# Patient Record
Sex: Female | Born: 1990 | Race: Black or African American | Hispanic: No | Smoking: Never smoker
Health system: Southern US, Community
[De-identification: ages and names within clinical notes are randomized; demographics above are authoritative.]

## PROBLEM LIST (undated history)

## (undated) DIAGNOSIS — J45909 Unspecified asthma, uncomplicated: Secondary | ICD-10-CM

## (undated) DIAGNOSIS — F259 Schizoaffective disorder, unspecified: Secondary | ICD-10-CM

## (undated) DIAGNOSIS — R0600 Dyspnea, unspecified: Secondary | ICD-10-CM

## (undated) DIAGNOSIS — F319 Bipolar disorder, unspecified: Secondary | ICD-10-CM

## (undated) DIAGNOSIS — T1491XA Suicide attempt, initial encounter: Secondary | ICD-10-CM

## (undated) DIAGNOSIS — G43909 Migraine, unspecified, not intractable, without status migrainosus: Secondary | ICD-10-CM

## (undated) DIAGNOSIS — F419 Anxiety disorder, unspecified: Secondary | ICD-10-CM

## (undated) DIAGNOSIS — F329 Major depressive disorder, single episode, unspecified: Secondary | ICD-10-CM

## (undated) DIAGNOSIS — F209 Schizophrenia, unspecified: Secondary | ICD-10-CM

## (undated) DIAGNOSIS — F32A Depression, unspecified: Secondary | ICD-10-CM

## (undated) DIAGNOSIS — S069X9A Unspecified intracranial injury with loss of consciousness of unspecified duration, initial encounter: Secondary | ICD-10-CM

## (undated) DIAGNOSIS — K219 Gastro-esophageal reflux disease without esophagitis: Secondary | ICD-10-CM

---
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
## 2010-02-16 ENCOUNTER — Emergency Department (HOSPITAL_COMMUNITY): Admission: EM | Admit: 2010-02-16 | Discharge: 2010-02-16 | Payer: Self-pay | Admitting: Emergency Medicine

## 2010-04-07 IMAGING — CT CT ANGIO CHEST
2 of 6 series · 19 of 36 positions shown · IV contrast (APPLIED)
Comparison: Chest radiograph 06/08/2008]

CLINICAL DATA: Right-sided chest pain, elevated D-dimer

CT ANGIOGRAPHY CHEST
TECHNIQUE: Multidetector CT imaging of the chest using the
standard protocol during bolus administration of intravenous
contrast. Multiplanar reconstructed images obtained and reviewed to
evaluate the vascular anatomy.
Contrast: 80 ml Omniscan 300 IV

[Series 19: thins for terarecon · axial · 0.60mm/px · z∈[+1130,+1384]mm · 18 of 283 slices shown]
[im 15/283  lung]
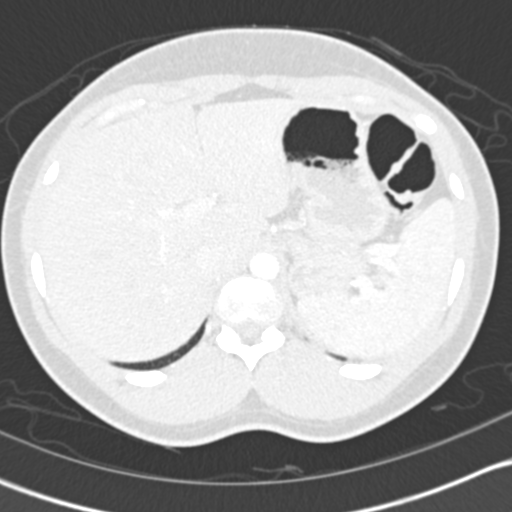
[im 29/283  mediastinal]
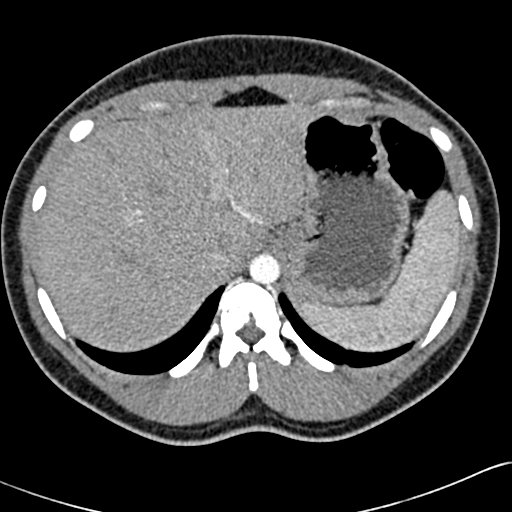
[im 43/283  lung]
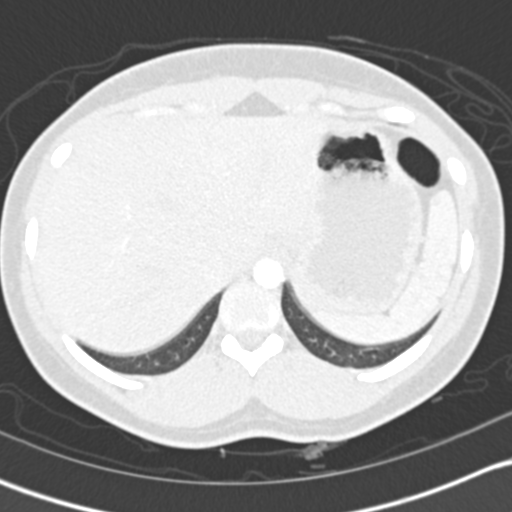
[im 57/283  mediastinal]
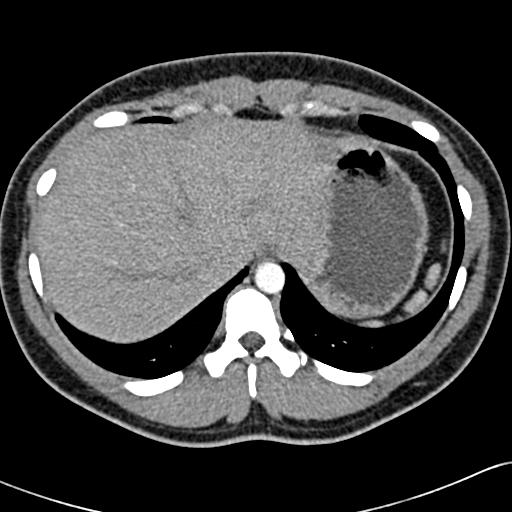
[im 71/283  lung]
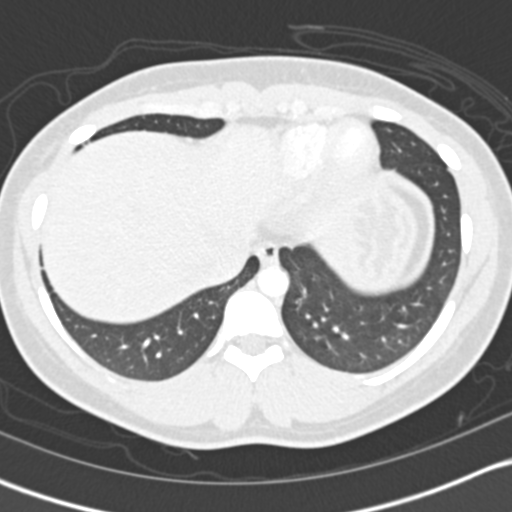
[im 85/283  mediastinal]
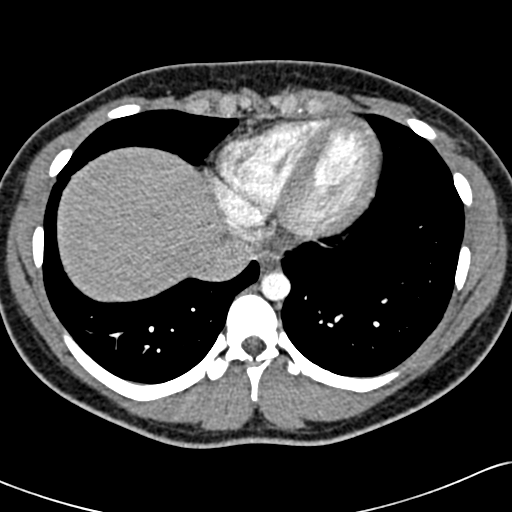
[im 99/283  lung]
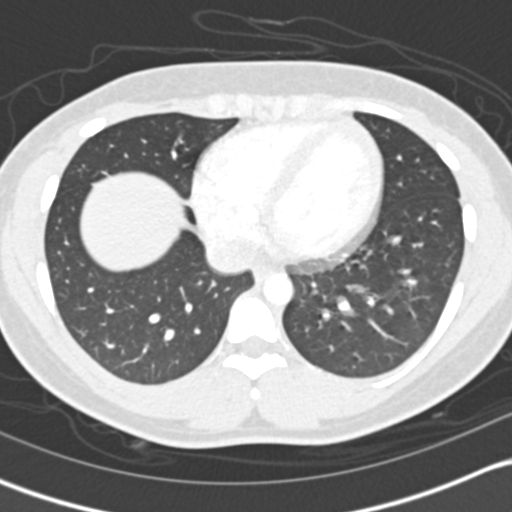
[im 113/283  mediastinal]
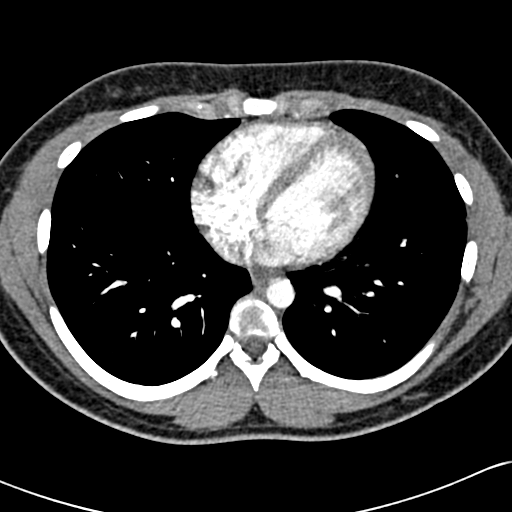
[im 127/283  lung]
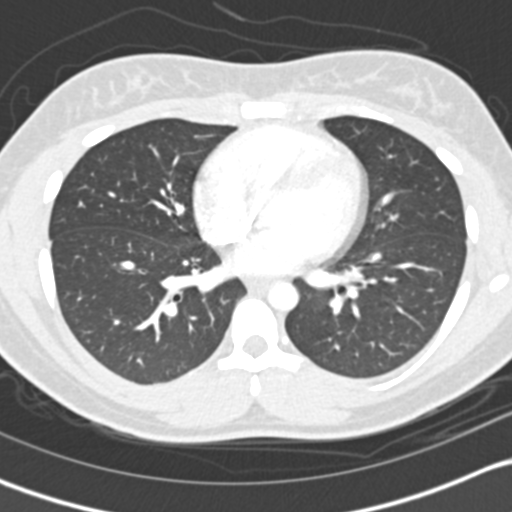
[im 156/283  mediastinal]
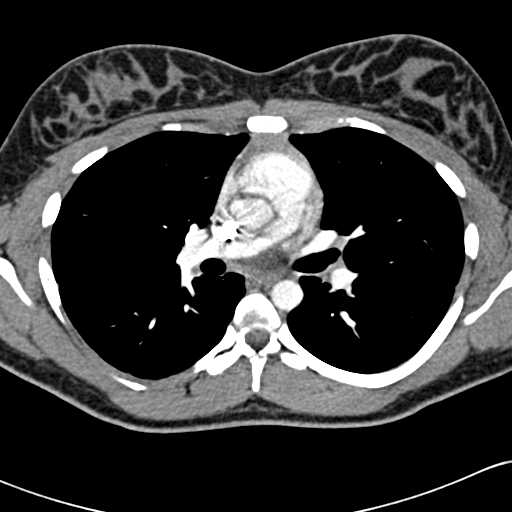
[im 170/283  lung]
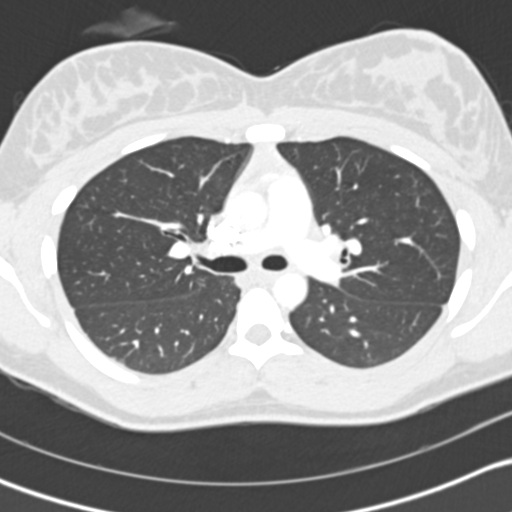
[im 184/283  mediastinal]
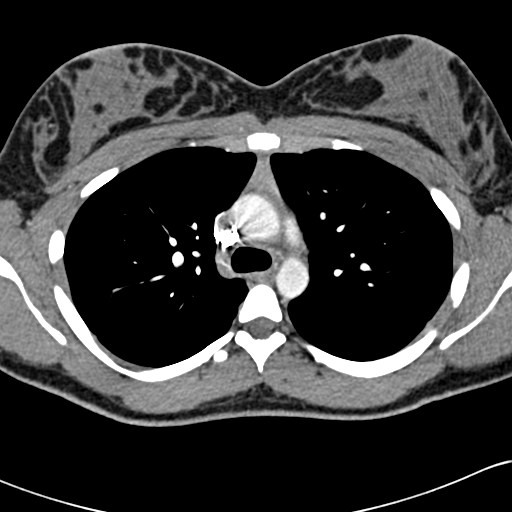
[im 198/283  lung]
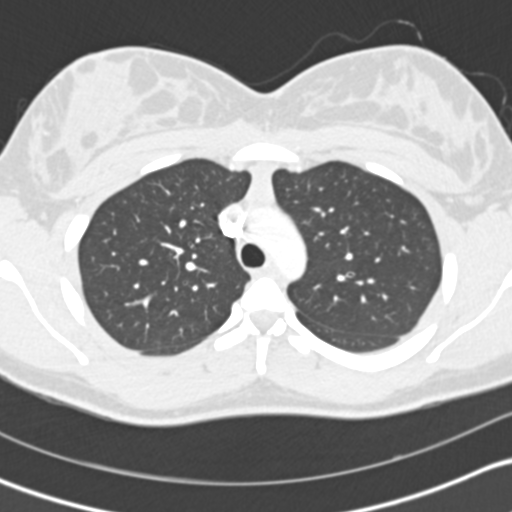
[im 212/283  mediastinal]
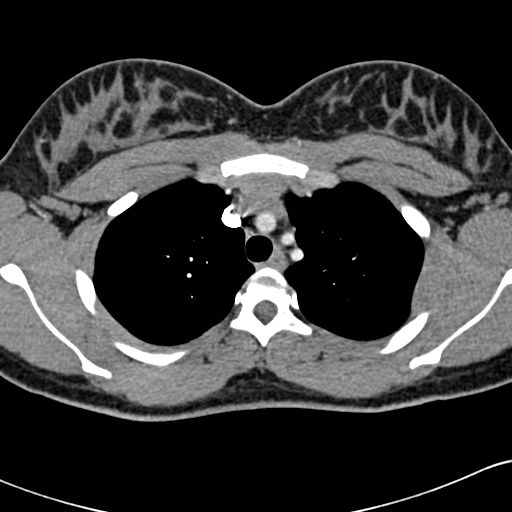
[im 226/283  lung]
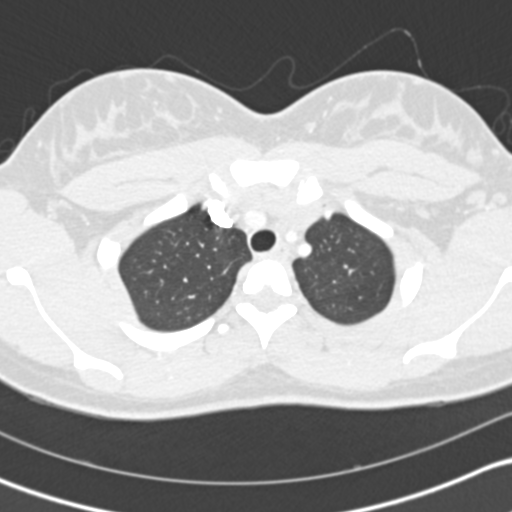
[im 240/283  mediastinal]
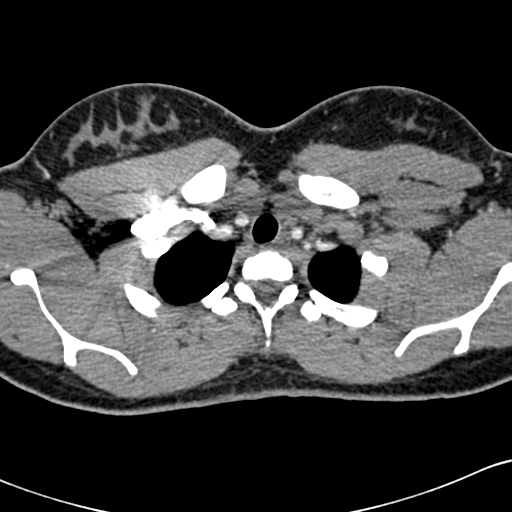
[im 254/283  lung]
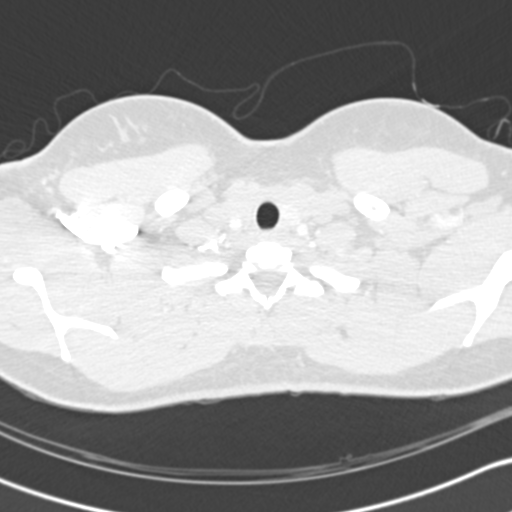
[im 268/283  mediastinal]
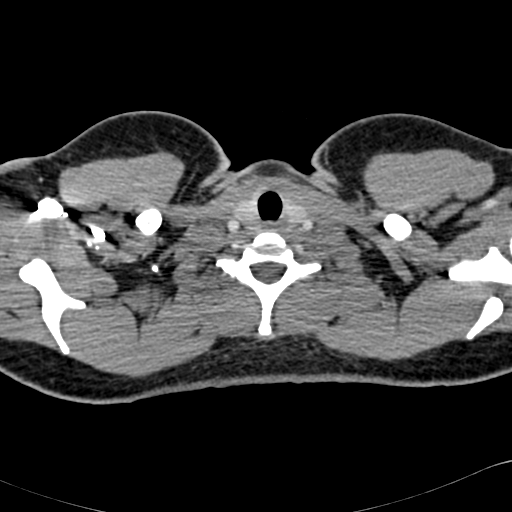

[Series 602: coronal mpr · coronal · 0.60mm/px · 1 of 89 slices shown]
[im 45/89  mediastinal]
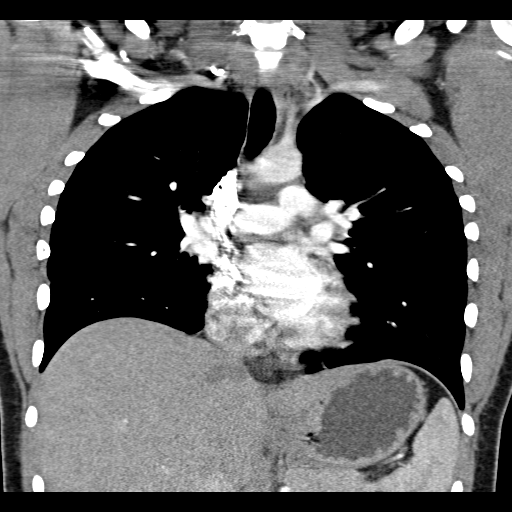

[19 of 36 positions shown; findings below may reference images not displayed]

FINDINGS: The study is of adequate technical quality for evaluation
for pulmonary embolism up to and including the 3rd order pulmonary
arteries. There is extensive streak artifact from dense
opacification of the IVC which produces artifactual linear
attenuation artifact in the right upper lobe pulmonary artery, but
there is no focal filling defect to suggest pulmonary arterial
embolism up to and including the 3rd order pulmonary arteries.

Heart size is normal.  No pericardial or pleural effusion.  No
lymphadenopathy. There is minimal tree in bud pulmonary nodular
opacity in the left upper lobe, image 41 and image 49 with a 6 mm
area of ill-defined nodular ground-glass opacity noted.  Central
airways are patent.

No acute bony finding.
IMPRESSION: No pulmonary embolism identified up to and including the 3rd order
pulmonary arteries.

Minimal left upper lobe tree in bud nodular opacity which may
reflect subclinical small airways infectious/inflammatory etiology.

## 2010-04-13 ENCOUNTER — Emergency Department (HOSPITAL_COMMUNITY): Admission: EM | Admit: 2010-04-13 | Discharge: 2010-04-13 | Payer: Self-pay | Admitting: Emergency Medicine

## 2010-04-26 DEATH — deceased

## 2010-05-30 ENCOUNTER — Emergency Department (HOSPITAL_COMMUNITY): Admission: EM | Admit: 2010-05-30 | Discharge: 2010-05-30 | Payer: Self-pay | Admitting: Emergency Medicine

## 2010-05-30 ENCOUNTER — Emergency Department (HOSPITAL_COMMUNITY): Admission: EM | Admit: 2010-05-30 | Discharge: 2010-05-30 | Payer: Self-pay | Admitting: Family Medicine

## 2010-06-01 ENCOUNTER — Emergency Department (HOSPITAL_COMMUNITY): Admission: EM | Admit: 2010-06-01 | Discharge: 2010-06-02 | Payer: Self-pay | Admitting: Emergency Medicine

## 2010-06-10 ENCOUNTER — Emergency Department (HOSPITAL_COMMUNITY): Admission: EM | Admit: 2010-06-10 | Discharge: 2010-06-10 | Payer: Self-pay | Admitting: Emergency Medicine

## 2010-08-09 ENCOUNTER — Emergency Department (HOSPITAL_COMMUNITY)
Admission: EM | Admit: 2010-08-09 | Discharge: 2010-08-09 | Payer: Self-pay | Source: Home / Self Care | Admitting: Emergency Medicine

## 2010-09-15 ENCOUNTER — Inpatient Hospital Stay (HOSPITAL_COMMUNITY)
Admission: AD | Admit: 2010-09-15 | Discharge: 2010-09-15 | Payer: Self-pay | Source: Home / Self Care | Attending: Obstetrics & Gynecology | Admitting: Obstetrics & Gynecology

## 2010-09-26 NOTE — L&D Delivery Note (Signed)
Delivery Note At 9:05 AM a viable female, Erin Hernandez, was delivered via Vaginal, Spontaneous Delivery (Presentation OA ;  ).  APGAR: 5, 8; weight 6 lb 13.3 oz (3099 g).   Placenta status: Intact, Spontaneous.  Cord: 3 vessels with the following complications: None.  Infant had rapid 2nd stage, with increased mucus at delivery.  O2 sats were variable (see nurses notes), so after assessment, infant was shown to mother and then taken to [facility] for further evaluation.  Had received initial dose of PCN.  Was complete @ 8:50am.  Anesthesia: Epidural Local  Episiotomy: None Lacerations: 1st degree perineal, 2nd degree left sulcus Suture Repair: 3.0 Est. Blood Loss (mL): 200  Mom to postpartum.  Baby to [facility], placed under O2 hood in nursery.  Sats now 96% under hood.  FOB at bedside.  Mom apprised of baby's status. Outpatient circ planned.  Erin Hernandez 04/29/2011, 9:52 AM

## 2010-11-18 ENCOUNTER — Inpatient Hospital Stay (HOSPITAL_COMMUNITY)
Admission: AD | Admit: 2010-11-18 | Discharge: 2010-11-18 | Disposition: A | Discharge status: Home / Self Care | Payer: Medicaid Other | Source: Ambulatory Visit | Attending: Obstetrics | Admitting: Obstetrics

## 2010-11-18 DIAGNOSIS — O2650 Maternal hypotension syndrome, unspecified trimester: Secondary | ICD-10-CM

## 2010-11-18 LAB — CBC
Hemoglobin: 11.7 g/dL — ABNORMAL LOW (ref 12.0–15.0)
MCHC: 33.4 g/dL (ref 30.0–36.0)
RDW: 14.3 % (ref 11.5–15.5)
WBC: 7.4 10*3/uL (ref 4.0–10.5)

## 2010-11-26 ENCOUNTER — Inpatient Hospital Stay (HOSPITAL_COMMUNITY)
Admission: AD | Admit: 2010-11-26 | Discharge: 2010-11-26 | Disposition: A | Discharge status: Home / Self Care | Payer: Medicaid Other | Source: Ambulatory Visit | Attending: Obstetrics & Gynecology | Admitting: Obstetrics & Gynecology

## 2010-11-26 DIAGNOSIS — K219 Gastro-esophageal reflux disease without esophagitis: Secondary | ICD-10-CM | POA: Insufficient documentation

## 2010-11-26 DIAGNOSIS — O99891 Other specified diseases and conditions complicating pregnancy: Secondary | ICD-10-CM | POA: Insufficient documentation

## 2010-12-06 LAB — WET PREP, GENITAL
Clue Cells Wet Prep HPF POC: NONE SEEN
Yeast Wet Prep HPF POC: NONE SEEN

## 2010-12-06 LAB — HEPATITIS B SURFACE ANTIGEN: Hepatitis B Surface Ag: NEGATIVE

## 2010-12-06 LAB — RPR: RPR: NONREACTIVE

## 2010-12-06 LAB — HCG, QUANTITATIVE, PREGNANCY: hCG, Beta Chain, Quant, S: 75710 m[IU]/mL — ABNORMAL HIGH (ref <5)

## 2010-12-06 LAB — CBC
HCT: 46.2 % — ABNORMAL HIGH (ref 36.0–46.0)
MCH: 29.1 pg (ref 26.0–34.0)
MCHC: 34.6 g/dL (ref 30.0–36.0)
RBC: 5.5 MIL/uL — ABNORMAL HIGH (ref 3.87–5.11)
RDW: 14.9 % (ref 11.5–15.5)
WBC: 12 10*3/uL — ABNORMAL HIGH (ref 4.0–10.5)

## 2010-12-06 LAB — BASIC METABOLIC PANEL
CO2: 23 mEq/L (ref 19–32)
Creatinine, Ser: 0.73 mg/dL (ref 0.4–1.2)
GFR calc Af Amer: >60 mL/min (ref >60)
GFR calc non Af Amer: >60 mL/min (ref >60)
Potassium: 4 mEq/L (ref 3.5–5.1)
Sodium: 137 mEq/L (ref 135–145)

## 2010-12-06 LAB — URINALYSIS, ROUTINE W REFLEX MICROSCOPIC
Ketones, ur: 15 mg/dL — AB
Nitrite: NEGATIVE
Protein, ur: NEGATIVE mg/dL
pH: 6 (ref 5.0–8.0)

## 2010-12-06 LAB — ABO/RH: RH Type: POSITIVE

## 2010-12-06 LAB — POCT PREGNANCY, URINE: Preg Test, Ur: POSITIVE

## 2010-12-09 LAB — POCT URINALYSIS DIPSTICK
Ketones, ur: NEGATIVE mg/dL
Protein, ur: 100 mg/dL — AB
pH: 6 (ref 5.0–8.0)

## 2010-12-09 LAB — POCT I-STAT, CHEM 8
Creatinine, Ser: 0.6 mg/dL (ref 0.4–1.2)
Glucose, Bld: 73 mg/dL (ref 70–99)
Hemoglobin: 13.6 g/dL (ref 12.0–15.0)
Potassium: 5.5 mEq/L — ABNORMAL HIGH (ref 3.5–5.1)
TCO2: 25 mmol/L (ref 0–100)

## 2010-12-09 LAB — COMPREHENSIVE METABOLIC PANEL
ALT: 11 U/L (ref 0–35)
AST: 15 U/L (ref 0–37)
Albumin: 3.5 g/dL (ref 3.5–5.2)
Alkaline Phosphatase: 72 U/L (ref 39–117)
Calcium: 9.3 mg/dL (ref 8.4–10.5)
GFR calc Af Amer: >60 mL/min (ref >60)
Glucose, Bld: 81 mg/dL (ref 70–99)
Potassium: 3.3 mEq/L — ABNORMAL LOW (ref 3.5–5.1)
Sodium: 142 mEq/L (ref 135–145)
Total Protein: 7 g/dL (ref 6.0–8.3)

## 2010-12-09 LAB — DIFFERENTIAL
Basophils Relative: 0 % (ref 0–1)
Eosinophils Absolute: 0.1 10*3/uL (ref 0.0–0.7)
Eosinophils Absolute: 0 10*3/uL (ref 0.0–0.7)
Eosinophils Relative: 1 % (ref 0–5)
Eosinophils Relative: 0 % (ref 0–5)
Lymphs Abs: 2.5 10*3/uL (ref 0.7–4.0)
Lymphs Abs: 1.8 10*3/uL (ref 0.7–4.0)
Monocytes Absolute: 1.6 10*3/uL — ABNORMAL HIGH (ref 0.1–1.0)
Monocytes Relative: 7 % (ref 3–12)
Monocytes Relative: 11 % (ref 3–12)

## 2010-12-09 LAB — CBC
HCT: 37.6 % (ref 36.0–46.0)
Hemoglobin: 11.9 g/dL — ABNORMAL LOW (ref 12.0–15.0)
MCH: 27.7 pg (ref 26.0–34.0)
MCV: 82.6 fL (ref 78.0–100.0)
Platelets: 251 10*3/uL (ref 150–400)
RBC: 4.3 MIL/uL (ref 3.87–5.11)
RDW: 14.7 % (ref 11.5–15.5)
WBC: 14.9 10*3/uL — ABNORMAL HIGH (ref 4.0–10.5)

## 2010-12-09 LAB — URINALYSIS, ROUTINE W REFLEX MICROSCOPIC
Bilirubin Urine: NEGATIVE
Glucose, UA: NEGATIVE mg/dL
Ketones, ur: NEGATIVE mg/dL
pH: 7 (ref 5.0–8.0)

## 2010-12-09 LAB — WET PREP, GENITAL: Yeast Wet Prep HPF POC: NONE SEEN

## 2010-12-11 LAB — GC/CHLAMYDIA PROBE AMP, GENITAL
Chlamydia, DNA Probe: POSITIVE — AB
GC Probe Amp, Genital: NEGATIVE

## 2010-12-11 LAB — URINALYSIS, ROUTINE W REFLEX MICROSCOPIC
Bilirubin Urine: NEGATIVE
Hgb urine dipstick: NEGATIVE
Nitrite: NEGATIVE
Protein, ur: NEGATIVE mg/dL
Specific Gravity, Urine: 1.021 (ref 1.005–1.030)
Urobilinogen, UA: 0.2 mg/dL (ref 0.0–1.0)

## 2010-12-11 LAB — WET PREP, GENITAL

## 2010-12-11 LAB — URINE MICROSCOPIC-ADD ON

## 2010-12-11 LAB — PREGNANCY, URINE: Preg Test, Ur: NEGATIVE

## 2010-12-13 LAB — POCT PREGNANCY, URINE: Preg Test, Ur: NEGATIVE

## 2010-12-17 LAB — POCT PREGNANCY, URINE: Preg Test, Ur: NEGATIVE

## 2010-12-17 LAB — POCT URINALYSIS DIP (DEVICE)
Bilirubin Urine: NEGATIVE
Ketones, ur: 15 mg/dL — AB
Protein, ur: NEGATIVE mg/dL
Specific Gravity, Urine: 1.01 (ref 1.005–1.030)

## 2010-12-21 ENCOUNTER — Inpatient Hospital Stay (HOSPITAL_COMMUNITY)
Admission: AD | Admit: 2010-12-21 | Discharge: 2010-12-21 | Disposition: A | Discharge status: Home / Self Care | Payer: Medicaid Other | Source: Ambulatory Visit | Attending: Obstetrics | Admitting: Obstetrics

## 2010-12-21 DIAGNOSIS — O99891 Other specified diseases and conditions complicating pregnancy: Secondary | ICD-10-CM | POA: Insufficient documentation

## 2010-12-21 DIAGNOSIS — O9989 Other specified diseases and conditions complicating pregnancy, childbirth and the puerperium: Secondary | ICD-10-CM

## 2010-12-21 DIAGNOSIS — J40 Bronchitis, not specified as acute or chronic: Secondary | ICD-10-CM | POA: Insufficient documentation

## 2011-01-06 LAB — URINALYSIS, ROUTINE W REFLEX MICROSCOPIC
Bilirubin Urine: NEGATIVE
Hgb urine dipstick: NEGATIVE
Nitrite: NEGATIVE
Protein, ur: NEGATIVE mg/dL
Specific Gravity, Urine: 1.009 (ref 1.005–1.030)
Specific Gravity, Urine: 1.012 (ref 1.005–1.030)
Urobilinogen, UA: 0.2 mg/dL (ref 0.0–1.0)
pH: 6.5 (ref 5.0–8.0)

## 2011-01-06 LAB — BASIC METABOLIC PANEL
CO2: 24 mEq/L (ref 19–32)
CO2: 24 mEq/L (ref 19–32)
Calcium: 9.6 mg/dL (ref 8.4–10.5)
Calcium: 9 mg/dL (ref 8.4–10.5)
Creatinine, Ser: 0.9 mg/dL (ref 0.4–1.2)
Glucose, Bld: 94 mg/dL (ref 70–99)
Potassium: 3.9 mEq/L (ref 3.5–5.1)
Sodium: 136 mEq/L (ref 135–145)
Sodium: 136 mEq/L (ref 135–145)

## 2011-01-06 LAB — DIFFERENTIAL
Basophils Absolute: 0 10*3/uL (ref 0.0–0.1)
Eosinophils Relative: 2 % (ref 0–5)
Lymphocytes Relative: 18 % — ABNORMAL LOW (ref 24–48)
Monocytes Absolute: 0.8 10*3/uL (ref 0.2–1.2)
Monocytes Relative: 8 % (ref 3–11)
Neutro Abs: 6.9 10*3/uL (ref 1.7–8.0)

## 2011-01-06 LAB — HEPATIC FUNCTION PANEL
ALT: 19 U/L (ref 0–35)
Albumin: 3.5 g/dL (ref 3.5–5.2)
Alkaline Phosphatase: 72 U/L (ref 47–119)
Total Bilirubin: 0.5 mg/dL (ref 0.3–1.2)
Total Protein: 6.6 g/dL (ref 6.0–8.3)

## 2011-01-06 LAB — LITHIUM LEVEL: Lithium Lvl: 0.96 mEq/L (ref 0.80–1.40)

## 2011-01-06 LAB — URINALYSIS, DIPSTICK ONLY
Bilirubin Urine: NEGATIVE
Leukocytes, UA: NEGATIVE
Nitrite: NEGATIVE
Specific Gravity, Urine: 1.014 (ref 1.005–1.030)
Urobilinogen, UA: 0.2 mg/dL (ref 0.0–1.0)
pH: 7 (ref 5.0–8.0)

## 2011-01-06 LAB — CBC
HCT: 37.9 % (ref 36.0–49.0)
Hemoglobin: 12.6 g/dL (ref 12.0–16.0)
MCHC: 33.3 g/dL (ref 31.0–37.0)
RBC: 4.47 MIL/uL (ref 3.80–5.70)
RDW: 14.5 % (ref 11.4–15.5)

## 2011-01-06 LAB — LIPID PANEL
HDL: 28 mg/dL — ABNORMAL LOW (ref >34)
LDL Cholesterol: 97 mg/dL (ref 0–109)
Total CHOL/HDL Ratio: 4.8 RATIO
Triglycerides: 46 mg/dL (ref <150)
VLDL: 9 mg/dL (ref 0–40)

## 2011-01-06 LAB — DRUGS OF ABUSE SCREEN W/O ALC, ROUTINE URINE
Amphetamine Screen, Ur: NEGATIVE
Barbiturate Quant, Ur: NEGATIVE
Benzodiazepines.: NEGATIVE
Marijuana Metabolite: NEGATIVE
Methadone: NEGATIVE
Phencyclidine (PCP): NEGATIVE

## 2011-01-06 LAB — GC/CHLAMYDIA PROBE AMP, URINE: GC Probe Amp, Urine: NEGATIVE

## 2011-01-06 LAB — RPR: RPR Ser Ql: NONREACTIVE

## 2011-01-10 LAB — URINALYSIS, ROUTINE W REFLEX MICROSCOPIC
Bilirubin Urine: NEGATIVE
Glucose, UA: NEGATIVE mg/dL
Hgb urine dipstick: NEGATIVE
Ketones, ur: NEGATIVE mg/dL
Nitrite: NEGATIVE
Specific Gravity, Urine: 1 — ABNORMAL LOW (ref 1.005–1.030)
pH: 6 (ref 5.0–8.0)

## 2011-01-10 LAB — CBC
HCT: 40.8 % (ref 36.0–49.0)
Hemoglobin: 13.6 g/dL (ref 12.0–16.0)
Platelets: 275 10*3/uL (ref 150–400)
RBC: 4.7 MIL/uL (ref 3.80–5.70)
WBC: 11.7 10*3/uL (ref 4.5–13.5)

## 2011-01-10 LAB — HEMOCCULT GUIAC POC 1CARD (OFFICE): Fecal Occult Bld: NEGATIVE

## 2011-01-10 LAB — POCT I-STAT, CHEM 8
BUN: 4 mg/dL — ABNORMAL LOW (ref 6–23)
Creatinine, Ser: 1 mg/dL (ref 0.4–1.2)
Glucose, Bld: 77 mg/dL (ref 70–99)
Potassium: 3.8 mEq/L (ref 3.5–5.1)
Sodium: 140 mEq/L (ref 135–145)
TCO2: 23 mmol/L (ref 0–100)

## 2011-01-10 LAB — DIFFERENTIAL
Eosinophils Relative: 2 % (ref 0–5)
Lymphocytes Relative: 23 % — ABNORMAL LOW (ref 24–48)
Lymphs Abs: 2.7 10*3/uL (ref 1.1–4.8)
Monocytes Relative: 10 % (ref 3–11)
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
## 2011-04-28 ENCOUNTER — Encounter (HOSPITAL_COMMUNITY): Payer: Self-pay | Admitting: *Deleted

## 2011-04-28 ENCOUNTER — Inpatient Hospital Stay (HOSPITAL_COMMUNITY)
Admission: AD | Admit: 2011-04-28 | Discharge: 2011-04-28 | Disposition: A | Discharge status: Home / Self Care | Payer: Medicaid Other | Source: Ambulatory Visit | Attending: Obstetrics and Gynecology | Admitting: Obstetrics and Gynecology

## 2011-04-28 ENCOUNTER — Other Ambulatory Visit: Payer: Self-pay

## 2011-04-28 DIAGNOSIS — O479 False labor, unspecified: Secondary | ICD-10-CM

## 2011-04-28 DIAGNOSIS — N898 Other specified noninflammatory disorders of vagina: Secondary | ICD-10-CM | POA: Insufficient documentation

## 2011-04-28 DIAGNOSIS — O9989 Other specified diseases and conditions complicating pregnancy, childbirth and the puerperium: Secondary | ICD-10-CM | POA: Insufficient documentation

## 2011-04-28 DIAGNOSIS — Z22330 Carrier of Group B streptococcus: Secondary | ICD-10-CM | POA: Insufficient documentation

## 2011-04-28 DIAGNOSIS — O99891 Other specified diseases and conditions complicating pregnancy: Secondary | ICD-10-CM | POA: Insufficient documentation

## 2011-04-28 MED ORDER — ZOLPIDEM TARTRATE 10 MG PO TABS
10.0000 mg | ORAL_TABLET | Freq: Once | ORAL | Status: AC
  Administered 2011-04-28: 10 mg via ORAL
  Filled 2011-04-28: qty 1

## 2011-04-28 MED ORDER — ZOLPIDEM TARTRATE 10 MG PO TABS: 10.0000 mg | ORAL_TABLET | Freq: Every evening | ORAL | Status: DC | PRN

## 2011-04-28 NOTE — Progress Notes (Signed)
SAYS NOTICED - WHEN CALLED [facility]- SAW BLOOD  AND SAW CLEAR FLUID  AT 9PM.  DENIES MRSA AND HSV.  UC ALL DAY-  VE- TODAY 1 CM

## 2011-04-28 NOTE — ED Provider Notes (Signed)
History   Pt is 19y.o. BF G2P0010 at 38.5 weeks per [facility] 06/07/11, presents for eval for CC continued brown/mucousy d/c and labor check. Pt seen at office for regular appt at 0830, and secondary to ctxs, asked to return after lunch to recheck cx.  Pt had IC last night.  Cx 1-2cm/75% at office Per Anabel Halon, CNM's exam.  Pt reports ctx frequency same, irregular.  No gushes of fluid.  GFM.   Followed by CNM service.  Hx r/f 1.  GBS positive  2.  Asthma  3.  Bipolar--no meds in pregnancy  4.  30 week transfer from [facility] to [facility] for CNM care  No chief complaint on file.  HPI  OB History    Grav Para Term Preterm Abortions TAB SAB Ect Mult Living   2    1 1     0      Past Medical History  Diagnosis Date  . Asthma     Past Surgical History  Procedure Date  . No past surgeries     History reviewed. No pertinent family history.  History  Substance Use Topics  . Smoking status: Former Games developer  . Smokeless tobacco: Not on file  . Alcohol Use: No    Allergies: No Known Allergies  Prescriptions prior to admission  Medication Sig Dispense Refill  . ALBUTEROL IN Inhale 4 puffs into the lungs daily as needed. For asthma       . DiphenhydrAMINE HCl (BENADRYL ALLERGY PO) Take 1 tablet by mouth daily.        . Prenatal Vit-Fe Fumarate-FA (PRENATAL PLUS) 65-1 MG TABS Take 1 tablet by mouth daily.          Review of Systems  Constitutional: Negative.   HENT: Negative.   Respiratory: Negative.   Genitourinary: Negative.    Physical Exam   Blood pressure 114/66, pulse 63, temperature 98.2 F (36.8 C), temperature source Oral, resp. rate 22, height 5\' 4"  (1.626 m), weight 88.055 kg (194 lb 2 oz).  Physical Exam  Constitutional: She is oriented to person, place, and time. She appears well-developed and well-nourished.  Respiratory: Effort normal.  GI: Soft. Bowel sounds are normal.       gravid  Genitourinary: Vagina normal.       Scant amt of bloody show on glove; SSE: small amt  of mucousy d/c, mixed with dk brown blood noted in vault. Cx:  1.5/80/-1, membranes palpated; to pt's Lt, posterior  Musculoskeletal: Normal range of motion.  Neurological: She is alert and oriented to person, place, and time. She has normal reflexes.  Skin: Skin is warm and dry.   EFM:  130, reactive, moderate variability, no decels TOCO:  irreg UC's q 2-7 minutes, mild-mod  MAU Course  Procedures 1.  SSE 2.  NST 3.  cx check 4.  Ambien 10mg  po x1 before d/c home  Assessment and Plan  1.  IUP at 38.5 2.  Reactive FHT 3.  GBS positive 4.  No cx change after 3 cx exams today  1.  D/c home with labor precautions & fkc's 2.  F/u next week at [facility] or prn concerns 3.  Ambien 10mg  po x1 before d/c; RX also given w/ #30 w/ 1RF  Damyn Weitzel H 04/28/2011, 10:57 PM

## 2011-04-29 ENCOUNTER — Inpatient Hospital Stay (HOSPITAL_COMMUNITY): Payer: Medicaid Other | Admitting: Anesthesiology

## 2011-04-29 ENCOUNTER — Encounter (HOSPITAL_COMMUNITY): Payer: Self-pay | Admitting: Anesthesiology

## 2011-04-29 ENCOUNTER — Other Ambulatory Visit: Payer: Self-pay | Admitting: Obstetrics and Gynecology

## 2011-04-29 ENCOUNTER — Inpatient Hospital Stay (HOSPITAL_COMMUNITY)
Admission: AD | Admit: 2011-04-29 | Discharge: 2011-05-01 | DRG: 775 | Disposition: A | Discharge status: Home / Self Care | Payer: Medicaid Other | Source: Ambulatory Visit | Attending: Obstetrics and Gynecology | Admitting: Obstetrics and Gynecology

## 2011-04-29 ENCOUNTER — Encounter (HOSPITAL_COMMUNITY): Payer: Self-pay | Admitting: *Deleted

## 2011-04-29 DIAGNOSIS — O99892 Other specified diseases and conditions complicating childbirth: Secondary | ICD-10-CM | POA: Diagnosis present

## 2011-04-29 DIAGNOSIS — Z22330 Carrier of Group B streptococcus: Secondary | ICD-10-CM

## 2011-04-29 DIAGNOSIS — Z3490 Encounter for supervision of normal pregnancy, unspecified, unspecified trimester: Secondary | ICD-10-CM

## 2011-04-29 DIAGNOSIS — F319 Bipolar disorder, unspecified: Secondary | ICD-10-CM | POA: Insufficient documentation

## 2011-04-29 HISTORY — DX: Bipolar disorder, unspecified: F31.9

## 2011-04-29 LAB — CBC
HCT: 36.1 % (ref 36.0–46.0)
Hemoglobin: 12 g/dL (ref 12.0–15.0)
MCV: 86.2 fL (ref 78.0–100.0)
RBC: 4.19 MIL/uL (ref 3.87–5.11)
RDW: 14.8 % (ref 11.5–15.5)
WBC: 10.7 10*3/uL — ABNORMAL HIGH (ref 4.0–10.5)

## 2011-04-29 MED ORDER — OXYCODONE-ACETAMINOPHEN 5-325 MG PO TABS: 2.0000 | ORAL_TABLET | ORAL | Status: DC | PRN

## 2011-04-29 MED ORDER — HYDROXYZINE HCL 50 MG/ML IM SOLN
50.0000 mg | Freq: Four times a day (QID) | INTRAMUSCULAR | Status: DC | PRN
  Filled 2011-04-29: qty 1

## 2011-04-29 MED ORDER — DIBUCAINE 1 % RE OINT: 1.0000 "application " | TOPICAL_OINTMENT | RECTAL | Status: DC | PRN

## 2011-04-29 MED ORDER — WITCH HAZEL-GLYCERIN EX PADS: 1.0000 "application " | MEDICATED_PAD | CUTANEOUS | Status: DC | PRN

## 2011-04-29 MED ORDER — OXYTOCIN 20 UNITS IN LACTATED RINGERS INFUSION - SIMPLE
125.0000 mL/h | Freq: Once | INTRAVENOUS | Status: DC
  Administered 2011-04-29: 999 mL/h via INTRAVENOUS
  Filled 2011-04-29: qty 1000

## 2011-04-29 MED ORDER — FENTANYL 2.5 MCG/ML BUPIVACAINE 1/10 % EPIDURAL INFUSION (WH - ANES)
14.0000 mL/h | INTRAMUSCULAR | Status: DC
  Administered 2011-04-29: 14 mL/h via EPIDURAL

## 2011-04-29 MED ORDER — ONDANSETRON HCL 4 MG/2ML IJ SOLN: 4.0000 mg | Freq: Four times a day (QID) | INTRAMUSCULAR | Status: DC | PRN

## 2011-04-29 MED ORDER — HYDROXYZINE HCL 50 MG PO TABS
50.0000 mg | ORAL_TABLET | Freq: Four times a day (QID) | ORAL | Status: DC | PRN
  Filled 2011-04-29: qty 1

## 2011-04-29 MED ORDER — EPHEDRINE 5 MG/ML INJ
INTRAVENOUS | Status: AC
  Filled 2011-04-29: qty 4

## 2011-04-29 MED ORDER — ONDANSETRON HCL 4 MG/2ML IJ SOLN: 4.0000 mg | INTRAMUSCULAR | Status: DC | PRN

## 2011-04-29 MED ORDER — CITRIC ACID-SODIUM CITRATE 334-500 MG/5ML PO SOLN: 30.0000 mL | ORAL | Status: DC | PRN

## 2011-04-29 MED ORDER — OXYTOCIN 20 UNITS IN LACTATED RINGERS INFUSION - SIMPLE: 125.0000 mL/h | INTRAVENOUS | Status: DC | PRN

## 2011-04-29 MED ORDER — ACETAMINOPHEN 325 MG PO TABS: 650.0000 mg | ORAL_TABLET | ORAL | Status: DC | PRN

## 2011-04-29 MED ORDER — PENICILLIN G POTASSIUM 5000000 UNITS IJ SOLR
5.0000 10*6.[IU] | Freq: Once | INTRAVENOUS | Status: DC
  Administered 2011-04-29: 5 10*6.[IU] via INTRAVENOUS
  Filled 2011-04-29: qty 5

## 2011-04-29 MED ORDER — SENNOSIDES-DOCUSATE SODIUM 8.6-50 MG PO TABS
2.0000 | ORAL_TABLET | Freq: Every day | ORAL | Status: DC
  Administered 2011-04-29 – 2011-04-30 (×2): 2 via ORAL

## 2011-04-29 MED ORDER — EPHEDRINE 5 MG/ML INJ
10.0000 mg | INTRAVENOUS | Status: DC | PRN
  Filled 2011-04-29: qty 4

## 2011-04-29 MED ORDER — BENZOCAINE-MENTHOL 20-0.5 % EX AERO
INHALATION_SPRAY | CUTANEOUS | Status: AC
  Filled 2011-04-29: qty 56

## 2011-04-29 MED ORDER — LIDOCAINE HCL (PF) 1 % IJ SOLN
30.0000 mL | INTRAMUSCULAR | Status: DC | PRN
  Administered 2011-04-29: 30 mL via SUBCUTANEOUS
  Filled 2011-04-29 (×2): qty 30

## 2011-04-29 MED ORDER — MAGNESIUM HYDROXIDE 400 MG/5ML PO SUSP: 30.0000 mL | ORAL | Status: DC | PRN

## 2011-04-29 MED ORDER — PHENYLEPHRINE 40 MCG/ML (10ML) SYRINGE FOR IV PUSH (FOR BLOOD PRESSURE SUPPORT)
PREFILLED_SYRINGE | INTRAVENOUS | Status: AC
  Filled 2011-04-29: qty 5

## 2011-04-29 MED ORDER — FENTANYL 2.5 MCG/ML BUPIVACAINE 1/10 % EPIDURAL INFUSION (WH - ANES)
INTRAMUSCULAR | Status: AC
  Filled 2011-04-29: qty 60

## 2011-04-29 MED ORDER — PENICILLIN G POTASSIUM 5000000 UNITS IJ SOLR
2.5000 10*6.[IU] | INTRAMUSCULAR | Status: DC
  Filled 2011-04-29 (×8): qty 2.5

## 2011-04-29 MED ORDER — IBUPROFEN 600 MG PO TABS: 600.0000 mg | ORAL_TABLET | Freq: Four times a day (QID) | ORAL | Status: DC | PRN

## 2011-04-29 MED ORDER — ONDANSETRON HCL 4 MG PO TABS: 4.0000 mg | ORAL_TABLET | ORAL | Status: DC | PRN

## 2011-04-29 MED ORDER — SIMETHICONE 80 MG PO CHEW
80.0000 mg | CHEWABLE_TABLET | ORAL | Status: DC | PRN
  Administered 2011-04-30 (×2): 80 mg via ORAL

## 2011-04-29 MED ORDER — LACTATED RINGERS IV SOLN
500.0000 mL | INTRAVENOUS | Status: DC | PRN
  Administered 2011-04-29: 1000 mL via INTRAVENOUS

## 2011-04-29 MED ORDER — OXYCODONE-ACETAMINOPHEN 5-325 MG PO TABS
1.0000 | ORAL_TABLET | ORAL | Status: DC | PRN
  Administered 2011-04-30 – 2011-05-01 (×7): 1 via ORAL
  Filled 2011-04-29 (×7): qty 1

## 2011-04-29 MED ORDER — MEASLES, MUMPS & RUBELLA VAC ~~LOC~~ INJ: 0.5000 mL | INJECTION | SUBCUTANEOUS | Status: DC | PRN

## 2011-04-29 MED ORDER — NALBUPHINE SYRINGE 5 MG/0.5 ML
5.0000 mg | INJECTION | INTRAMUSCULAR | Status: DC | PRN
  Filled 2011-04-29: qty 0.5

## 2011-04-29 MED ORDER — LIDOCAINE HCL 1.5 % IJ SOLN
INTRAMUSCULAR | Status: DC | PRN
  Administered 2011-04-29: 2 mL
  Administered 2011-04-29 (×2): 5 mL

## 2011-04-29 MED ORDER — IBUPROFEN 600 MG PO TABS
600.0000 mg | ORAL_TABLET | Freq: Four times a day (QID) | ORAL | Status: DC
  Administered 2011-04-29 – 2011-05-01 (×8): 600 mg via ORAL
  Filled 2011-04-29 (×8): qty 1

## 2011-04-29 MED ORDER — BENZOCAINE-MENTHOL 20-0.5 % EX AERO: 1.0000 "application " | INHALATION_SPRAY | CUTANEOUS | Status: DC | PRN

## 2011-04-29 MED ORDER — PRENATAL PLUS 27-1 MG PO TABS
1.0000 | ORAL_TABLET | Freq: Every day | ORAL | Status: DC
  Administered 2011-04-30 – 2011-05-01 (×2): 1 via ORAL
  Filled 2011-04-29 (×2): qty 1

## 2011-04-29 MED ORDER — DIPHENHYDRAMINE HCL 25 MG PO CAPS: 25.0000 mg | ORAL_CAPSULE | Freq: Four times a day (QID) | ORAL | Status: DC | PRN

## 2011-04-29 MED ORDER — PHENYLEPHRINE 40 MCG/ML (10ML) SYRINGE FOR IV PUSH (FOR BLOOD PRESSURE SUPPORT)
80.0000 ug | PREFILLED_SYRINGE | INTRAVENOUS | Status: DC | PRN
  Filled 2011-04-29: qty 5

## 2011-04-29 MED ORDER — LANOLIN HYDROUS EX OINT: TOPICAL_OINTMENT | CUTANEOUS | Status: DC | PRN

## 2011-04-29 MED ORDER — TETANUS-DIPHTH-ACELL PERTUSSIS 5-2.5-18.5 LF-MCG/0.5 IM SUSP
0.5000 mL | Freq: Once | INTRAMUSCULAR | Status: AC
  Administered 2011-05-01: 0.5 mL via INTRAMUSCULAR
  Filled 2011-04-29: qty 0.5

## 2011-04-29 MED ORDER — ZOLPIDEM TARTRATE 5 MG PO TABS: 5.0000 mg | ORAL_TABLET | Freq: Every evening | ORAL | Status: DC | PRN

## 2011-04-29 MED ORDER — LACTATED RINGERS IV SOLN: 500.0000 mL | Freq: Once | INTRAVENOUS | Status: DC

## 2011-04-29 MED ORDER — FLEET ENEMA 7-19 GM/118ML RE ENEM: 1.0000 | ENEMA | RECTAL | Status: DC | PRN

## 2011-04-29 MED ORDER — DIPHENHYDRAMINE HCL 50 MG/ML IJ SOLN: 12.5000 mg | INTRAMUSCULAR | Status: DC | PRN

## 2011-04-29 MED ORDER — LACTATED RINGERS IV SOLN
INTRAVENOUS | Status: DC
  Administered 2011-04-29: 1000 mL via INTRAVENOUS
  Administered 2011-04-29: 500 mL via INTRAVENOUS

## 2011-04-29 NOTE — Progress Notes (Signed)
Delivery of live viable female by V. Emilee Hero, CNM

## 2011-04-29 NOTE — Initial Assessments (Signed)
Report called to [employer] nurse on birthing suites.

## 2011-04-29 NOTE — Anesthesia Postprocedure Evaluation (Signed)
  Anesthesia Post-op Note  Patient: Erin Hernandez  Procedure(s) Performed: * No procedures listed *  Patient Location: Mother/Baby  Anesthesia Type: Epidural  Level of Consciousness: awake, alert  and oriented  Airway and Oxygen Therapy: Patient Spontanous Breathing  Post-op Pain: mild  Post-op Assessment: Patient's Cardiovascular Status Stable and Respiratory Function Stable  Post-op Vital Signs: Reviewed and stable  Complications: No apparent anesthesia complications

## 2011-04-29 NOTE — Anesthesia Procedure Notes (Signed)
Epidural Patient location during procedure: OB Start time: 04/29/2011 6:28 AM Reason for block: procedure for pain  Staffing Performed by: anesthesiologist   Preanesthetic Checklist Completed: patient identified, site marked, surgical consent, pre-op evaluation, timeout performed, IV checked, risks and benefits discussed and monitors and equipment checked  Epidural Patient position: sitting Prep: site prepped and draped and DuraPrep Patient monitoring: continuous pulse ox and blood pressure Approach: midline Injection technique: LOR air  Needle:  Needle type: Tuohy  Needle gauge: 17 G Needle length: 9 cm Needle insertion depth: 5 cm cm Catheter type: closed end flexible Catheter size: 19 Gauge Catheter at skin depth: 10 cm Test dose: negative  Assessment Events: blood not aspirated, injection not painful, no injection resistance, negative IV test and no paresthesia  Additional Notes Discussed risk of headache, infection, bleeding, nerve injury and failed or incomplete block.  Patient voices understanding and wishes to proceed.

## 2011-04-29 NOTE — Progress Notes (Signed)
I & O cath 300 cc

## 2011-04-29 NOTE — Plan of Care (Signed)
Erin Hernandez [place] is a 20 y.o. G2P0010 at [redacted]w[redacted]d by ultrasound admitted for active labor  Subjective: Pt sleeping soundly s/p epidural.  PCN started around 0530, and still infusing.  Foley not inserted yet.  Objective: BP 119/54  Pulse 71  Temp(Src) 97 F (36.1 C) (Oral)  Resp 16  Ht 5\' 5"  (1.651 m)  Wt 87.544 kg (193 lb)  BMI 32.12 kg/m2  SpO2 100%      FHT:  FHR: 130 bpm, variability: moderate,  accelerations:  Present,  decelerations:  Absent UC:   irregular, every 2-6 minutes SVE:   Dilation: 3 Effacement (%): 100 Station: -1 Exam by:: H Nickolai Rinks  Labs: Lab Results  Component Value Date   WBC 10.7* 04/29/2011   HGB 12.0 04/29/2011   HCT 36.1 04/29/2011   MCV 86.2 04/29/2011   PLT 172 04/29/2011    Assessment / Plan: Spontaneous labor, progressing normally GBS positive  Labor: Progressing normally Preeclampsia:  n/a Fetal Wellbeing:  Category I Pain Control:  Epidural I/D:  n/a Anticipated MOD:  NSVD  Will update oncoming CNM, Nigel Bridgeman.  Anticipate AROM prn augmentation.  [name] 04/29/2011, 6:55 AM

## 2011-04-29 NOTE — Anesthesia Preprocedure Evaluation (Signed)
Anesthesia Evaluation  Name, MR# and DOB Patient awake  General Assessment Comment  Reviewed: Allergy & Precautions, H&P , Patient's Chart, lab work & pertinent test results and reviewed documented beta blocker date and time   Airway Mallampati: I TM Distance: >3 FB Neck ROM: full    Dental  (+) Teeth Intact   Pulmonary  asthma (no recent inhaler use)  clear to auscultation    Cardiovascular regular Normal   Neuro/Psych (+) {AN ROS/MED HX NEURO HEADACHES (+) PSYCHIATRIC DISORDERS, Bipolar Disorder,   GI/Hepatic/Renal   Endo/Other   Abdominal   Musculoskeletal  Hematology   Peds  Reproductive/Obstetrics (+) Pregnancy   Anesthesia Other Findings             Anesthesia Physical Anesthesia Plan  ASA: II  Anesthesia Plan: Epidural   Post-op Pain Management:    Induction:   Airway Management Planned:   Additional Equipment:   Intra-op Plan:   Post-operative Plan:   Informed Consent: I have reviewed the patients History and Physical, chart, labs and discussed the procedure including the risks, benefits and alternatives for the proposed anesthesia with the patient or authorized representative who has indicated his/her understanding and acceptance.     Plan Discussed with:   Anesthesia Plan Comments:         Anesthesia Quick Evaluation

## 2011-04-29 NOTE — Progress Notes (Signed)
UR Chart review completed.  

## 2011-04-29 NOTE — H&P (Signed)
Erin Hernandez is a 20 y.o.BF female presenting for labor check. Pt was seen twice at [facility] for possible labor yesterday and also d/c'd from MAU late last night after cx exam unchanged. Pt given Ambien 10mg  before d/c'd with no relief and unable to rest, and ctxs cont'd to get stronger and feeling intermittent vag/rectal Pressure. Maternal Medical History:  Reason for admission: Reason for admission: contractions and nausea.  Contractions: Onset was yesterday.   Frequency: irregular.   Perceived severity is moderate.    Fetal activity: Perceived fetal activity is normal.   Last perceived fetal movement was within the past hour.    Prenatal complications: 1.  Questionable LMP 2.  Asthma 3.  Bipolar (off meds since 1st trimester) 4.  Quit smoking in May 5.  Transfer of care to [facility] from [facility] around 30 weeks    OB History    Grav Para Term Preterm Abortions TAB SAB Ect Mult Living   2    1 1     0     Past Medical History  Diagnosis Date  . Asthma    Past Surgical History  Procedure Date  . No past surgeries    Family History: family history is not on file.  CHTN: MGM & PGM.  PGM-asthma. MU seizures.  PGM-unsure type of cancer.  Bipolar:  MGM & m. Uncle. Social History:  reports that she has quit smoking. She does not have any smokeless tobacco history on file. She reports that she does not drink alcohol or use illicit drugs.  Review of Systems  Constitutional: Negative.   Eyes: Negative.   Respiratory: Negative.   Cardiovascular: Negative.   Gastrointestinal: Positive for nausea.       Feel vag/rectal pressure since left hospital earlier tonight, but unable to have BM; Last nml BM yesterday  Genitourinary: Negative.   Skin: Negative.   Neurological: Negative.     Dilation: 3 Effacement (%): 100 Station: -1 Exam by:: H Kailand Seda Blood pressure 113/68, pulse 72, temperature 97 F (36.1 C), temperature source Oral, resp. rate 18. Maternal Exam:  Uterine Assessment:  Contraction strength is moderate.  Contraction frequency is irregular.   Abdomen: Patient reports no abdominal tenderness. Estimated fetal weight is 7-8 lbs.   Fetal presentation: vertex  Introitus: Normal vulva.   Fetal Exam Fetal Monitor Review: Mode: ultrasound.   Baseline rate: 130.  Variability: moderate (6-25 bpm).   Pattern: accelerations present and no decelerations.    Fetal State Assessment: Category I - tracings are normal.     Physical Exam  Constitutional: She is oriented to person, place, and time. She appears well-developed and well-nourished.  Cardiovascular: Normal rate and regular rhythm.   Respiratory: Effort normal and breath sounds normal.  GI: Soft. Bowel sounds are normal.       gravid  Genitourinary:       Cx:  3+/100/-1 BBOW posterior to pt's left  Musculoskeletal: Normal range of motion.  Neurological: She is alert and oriented to person, place, and time. She has normal reflexes.  Skin: Skin is warm and dry.  Psychiatric: She has a normal mood and affect. Her behavior is normal. Thought content normal.    Prenatal labs: ABO, Rh:  O Positive Antibody:  Negative Rubella:  Immune RPR:   NR HBsAg:   Negative HIV:   Nonreactive GBS:   Positive 2hr gtt WNL at [facility] Quad-Nml at [facility] Screen Negative  Assessment/Plan: 1.  IUP at 38.6 2.  Early active labor 3.  GBS positive 4. Prolonged latent labor 5.  Reactive FHT 6.  Bipolar--no meds since 1st trimester (plans to bottlefeed in order to restart PP) 7.  Stable asthma (previous smoker)  1.  Admit to BS with rout L&D orders to obtain epidural ASAP 2.  PCN-G per GBS protocol 3.  AROM prn augmentation 4.  C/w MD prn 5.  Anticipate SVD 6.  Pt plans OP circ at [facility] H 04/29/2011, 4:17 AM

## 2011-04-29 NOTE — Progress Notes (Signed)
  Subjective: Comfortable after epidural, slight hot spot on left side.    Objective: BP 116/72  Pulse 76  Temp(Src) 97.7 F (36.5 C) (Oral)  Resp 18  Ht 5\' 5"  (1.651 m)  Wt 87.544 kg (193 lb)  BMI 32.12 kg/m2  SpO2 99%      FHT:  FHR: 140 bpm, variability: moderate,  accelerations:  Present,  decelerations:  Absent UC:   irregular, every 2-5 minutes SVE:   6 cm, 100%, vtx -1, BBOW AROM--clear fluid, mild variable just after ROM, resolved with position change.  Labs: Lab Results  Component Value Date   WBC 10.7* 04/29/2011   HGB 12.0 04/29/2011   HCT 36.1 04/29/2011   MCV 86.2 04/29/2011   PLT 172 04/29/2011    Assessment / Plan: Spontaneous labor, progressing normally  Will continue to observe. Augment prn.   Erin Hernandez L 04/29/2011, 7:57 AM

## 2011-04-30 LAB — CBC
HCT: 30.6 % — ABNORMAL LOW (ref 36.0–46.0)
MCHC: 33.3 g/dL (ref 30.0–36.0)
RDW: 14.7 % (ref 11.5–15.5)

## 2011-04-30 NOTE — Progress Notes (Addendum)
Post Partum Day 1 Subjective: no complaints.  Up ad lib.  Infant in NICU with likely TTN, but off O2.  Awaiting decision regarding anticipated length of stay for baby.  Bottlefeeding.  Emotionally stable, no PPD.  Objective: Blood pressure 106/58, pulse 64, temperature 98.5 F (36.9 C), temperature source Oral, resp. rate 18, height 5\' 5"  (1.651 m), weight 87.544 kg (193 lb), SpO2 99.00%, unknown if currently breastfeeding.  Physical Exam:  General: alert Lochia: appropriate Uterine Fundus: firm Incision: healing well DVT Evaluation: No evidence of DVT seen on physical exam.   Basename 04/30/11 0530 04/29/11 0457  HGB 10.2* 12.0  HCT 30.6* 36.1    Assessment/Plan: Plan for discharge tomorrow. Support for mom with NICU infant. Patient plans to restart bipolar meds--patient was previously followed at [facility] prior to pregnancy, but advises that site is closing.  Wants a referral to another psychiatric provider for care.   Will have office refer patient to [facility] for restarting meds.  Was previously on Seraquil, Lithium, Risperdal, Trazadone--stopped in November, 2011.    LOS: 1 day   Maury Groninger L 04/30/2011, 7:18 AM

## 2011-04-30 NOTE — Progress Notes (Signed)
NICU Handout and BF handout given to pt.  Pt states she is not pumping anymore and does not want to pump.  I encouraged her to provide colostrum for her infant and reviewed the benefits of colostrum.  Mom stated "I don't want to do it."  She stated her initial plan was to Formula feed, but thought she might try providing some colostrum.  C/o uterine cramping and breast pain.  Educated about uterine cramping r/t BF.  Offered to assess a pumping session to determine the cause of the breast pain.  Pt declined offer and said she was not going to pump anymore.

## 2011-05-01 MED ORDER — OXYCODONE-ACETAMINOPHEN 5-325 MG PO TABS: 1.0000 | ORAL_TABLET | ORAL | Status: AC | PRN

## 2011-05-01 MED ORDER — IBUPROFEN 600 MG PO TABS: 600.0000 mg | ORAL_TABLET | Freq: Four times a day (QID) | ORAL | Status: AC

## 2011-05-01 NOTE — Progress Notes (Signed)
Post Partum Day 2  Subjective: up ad lib, voiding, tolerating PO, + flatus and BM since delivery.  Requests percocet prescription, stating "Motrin doesn't work."  VB stable.  Desire nexplanon.  Declines [facility] & desires referral to "[facility]."  FOB's mother at bs and pt will be living with her once NB discharged from NICU & aware of pt's h/o and need for immediate referral.  Objective: Blood pressure 112/68, pulse 81, temperature 98.1 F (36.7 C), temperature source Oral, resp. rate 18, height 5\' 5"  (1.651 m), weight 87.544 kg (193 lb), SpO2 99.00%, unknown if currently breastfeeding.  Physical Exam:  General: alert, cooperative and no distress Lochia: appropriate Uterine Fundus: firm Incision: n/a DVT Evaluation: No evidence of DVT seen on physical exam. Negative Homan's sign. No significant calf/ankle edema.   Basename 04/30/11 0530 04/29/11 0457  HGB 10.2* 12.0  HCT 30.6* 36.1    Assessment/Plan: Discharge home, Social Work consult and Contraception Nexplanon. Rx given for Motrin, Percocet (#20).   Office to call pt tomorrow morning to schedule 5 wk PP appointment, & get referral to [facility] if possible ASAP to restart bipolar Meds.   D/C instructions per [facility] pamphlet. Formula-feeding solely--discussed breast care. NBM to remain in NICU w/ tentative plans for d/c tomorrow.  Pt to room-in on 2nd floor tonight. Social work consult completed & additional resources given.    LOS: 2 days   Erin Hernandez H 05/01/2011, 12:32 PM

## 2011-05-01 NOTE — Plan of Care (Signed)
Pt not currently in room.  Suspect in NICU visiting NB. Will return shortly to complete AM assessment. Anticipating d/c home today.  Unsure NB's plan for LOS.

## 2011-05-01 NOTE — Plan of Care (Signed)
Pt still not in room.  Will have RN call me when patient returns from NICU.

## 2011-05-01 NOTE — Discharge Summary (Signed)
Obstetric Discharge Summary Reason for Admission: onset of labor Prenatal Procedures: ultrasound Intrapartum Procedures: spontaneous vaginal delivery Postpartum Procedures: none Complications-Operative and Postpartum: 1st degree sulcus Hemoglobin  Date Value Range Status  04/30/2011 10.2* 12.0-15.0 (g/dL) Final     HCT  Date Value Range Status  04/30/2011 30.6* 36.0-46.0 (%) Final    Discharge Diagnoses: Term Pregnancy-delivered and Bipolar, asthma, formula feeding, previous smoker  Discharge Information: Date: 05/01/2011 Activity: pelvic rest Diet: routine and iron rich Medications: PNV, Ibuprophen and Percocet Condition: stable Instructions: refer to practice specific booklet and also given PPD pamphlet Discharge to: home Follow-up Information    Follow up with [facility] in 5 weeks. (Office to call patient tomorrow morning to assist with behavioral health referral to restart bipolar meds &  schedule 5 week appointment)          Newborn Data: Live born female  Birth Weight: 6 lb 13.3 oz (3099 g) APGAR: 5, 8  NBM remains in NICU at pt's time of discharge.  Tab Rylee H 05/01/2011, 12:38 PM

## 2011-06-28 LAB — LITHIUM LEVEL: Lithium Lvl: 0.32 — ABNORMAL LOW

## 2011-06-29 LAB — URINALYSIS, ROUTINE W REFLEX MICROSCOPIC
Bilirubin Urine: NEGATIVE
Ketones, ur: NEGATIVE
Nitrite: NEGATIVE
Specific Gravity, Urine: 1.01
Urobilinogen, UA: 0.2

## 2011-06-29 LAB — DIFFERENTIAL
Eosinophils Absolute: 0.1
Lymphs Abs: 0.8 — ABNORMAL LOW
Neutrophils Relative %: 77 — ABNORMAL HIGH

## 2011-06-29 LAB — POCT I-STAT, CHEM 8
Calcium, Ion: 1.2
Glucose, Bld: 87
HCT: 47
Hemoglobin: 16
Potassium: 4.1

## 2011-06-29 LAB — D-DIMER, QUANTITATIVE: D-Dimer, Quant: 1.39 — ABNORMAL HIGH

## 2011-06-29 LAB — RAPID STREP SCREEN (MED CTR MEBANE ONLY): Streptococcus, Group A Screen (Direct): NEGATIVE

## 2011-06-29 LAB — POCT PREGNANCY, URINE: Preg Test, Ur: NEGATIVE

## 2011-06-29 LAB — CBC
MCV: 87
Platelets: 187
WBC: 7.9

## 2011-08-25 ENCOUNTER — Emergency Department (HOSPITAL_COMMUNITY)
Admission: EM | Admit: 2011-08-25 | Discharge: 2011-08-25 | Disposition: A | Discharge status: Home / Self Care | Payer: Medicaid Other | Attending: Emergency Medicine | Admitting: Emergency Medicine

## 2011-08-25 ENCOUNTER — Encounter (HOSPITAL_COMMUNITY): Payer: Self-pay | Admitting: *Deleted

## 2011-08-25 DIAGNOSIS — H1130 Conjunctival hemorrhage, unspecified eye: Secondary | ICD-10-CM | POA: Insufficient documentation

## 2011-08-25 DIAGNOSIS — S0510XA Contusion of eyeball and orbital tissues, unspecified eye, initial encounter: Secondary | ICD-10-CM | POA: Insufficient documentation

## 2011-08-25 DIAGNOSIS — IMO0002 Reserved for concepts with insufficient information to code with codable children: Secondary | ICD-10-CM | POA: Insufficient documentation

## 2011-08-25 MED ORDER — IBUPROFEN 800 MG PO TABS: 800.0000 mg | ORAL_TABLET | Freq: Three times a day (TID) | ORAL | Status: AC | PRN

## 2011-08-25 NOTE — ED Provider Notes (Signed)
Medical screening examination/treatment/procedure(s) were performed by non-physician practitioner and as supervising physician I was immediately available for consultation/collaboration.   Gwyneth Sprout, MD 08/25/11 [phone]
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
## 2012-04-05 ENCOUNTER — Emergency Department (HOSPITAL_COMMUNITY)
Admission: EM | Admit: 2012-04-05 | Discharge: 2012-04-05 | Disposition: A | Discharge status: Home / Self Care | Payer: Medicaid Other | Attending: Emergency Medicine | Admitting: Emergency Medicine

## 2012-04-05 ENCOUNTER — Encounter (HOSPITAL_COMMUNITY): Payer: Self-pay | Admitting: Nurse Practitioner

## 2012-04-05 ENCOUNTER — Emergency Department (HOSPITAL_COMMUNITY): Payer: Medicaid Other

## 2012-04-05 DIAGNOSIS — F17200 Nicotine dependence, unspecified, uncomplicated: Secondary | ICD-10-CM | POA: Insufficient documentation

## 2012-04-05 DIAGNOSIS — J45909 Unspecified asthma, uncomplicated: Secondary | ICD-10-CM | POA: Insufficient documentation

## 2012-04-05 DIAGNOSIS — F319 Bipolar disorder, unspecified: Secondary | ICD-10-CM | POA: Insufficient documentation

## 2012-04-05 DIAGNOSIS — R0789 Other chest pain: Secondary | ICD-10-CM | POA: Insufficient documentation

## 2012-04-05 NOTE — ED Notes (Signed)
Pt returned from xray

## 2012-04-05 NOTE — ED Notes (Signed)
C/o bilateral rib pain every time she coughs, drinks or takes a deep breath x 2 weeks. Denies sob, fevers.
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
## 2013-01-18 ENCOUNTER — Emergency Department (HOSPITAL_COMMUNITY)
Admission: EM | Admit: 2013-01-18 | Discharge: 2013-01-19 | Disposition: A | Discharge status: Home / Self Care | Payer: No Typology Code available for payment source | Attending: Emergency Medicine | Admitting: Emergency Medicine

## 2013-01-18 ENCOUNTER — Emergency Department (HOSPITAL_COMMUNITY): Payer: No Typology Code available for payment source

## 2013-01-18 ENCOUNTER — Encounter (HOSPITAL_COMMUNITY): Payer: Self-pay | Admitting: Emergency Medicine

## 2013-01-18 DIAGNOSIS — Y92410 Unspecified street and highway as the place of occurrence of the external cause: Secondary | ICD-10-CM | POA: Diagnosis not present

## 2013-01-18 DIAGNOSIS — M542 Cervicalgia: Secondary | ICD-10-CM

## 2013-01-18 DIAGNOSIS — J45909 Unspecified asthma, uncomplicated: Secondary | ICD-10-CM | POA: Diagnosis not present

## 2013-01-18 DIAGNOSIS — F17200 Nicotine dependence, unspecified, uncomplicated: Secondary | ICD-10-CM | POA: Insufficient documentation

## 2013-01-18 DIAGNOSIS — S0993XA Unspecified injury of face, initial encounter: Secondary | ICD-10-CM | POA: Diagnosis present

## 2013-01-18 DIAGNOSIS — S199XXA Unspecified injury of neck, initial encounter: Secondary | ICD-10-CM | POA: Diagnosis present

## 2013-01-18 DIAGNOSIS — Z79899 Other long term (current) drug therapy: Secondary | ICD-10-CM | POA: Insufficient documentation

## 2013-01-18 DIAGNOSIS — F319 Bipolar disorder, unspecified: Secondary | ICD-10-CM | POA: Insufficient documentation

## 2013-01-18 DIAGNOSIS — Y9389 Activity, other specified: Secondary | ICD-10-CM | POA: Insufficient documentation

## 2013-01-18 DIAGNOSIS — S8990XA Unspecified injury of unspecified lower leg, initial encounter: Secondary | ICD-10-CM | POA: Diagnosis not present

## 2013-01-18 DIAGNOSIS — S99929A Unspecified injury of unspecified foot, initial encounter: Secondary | ICD-10-CM | POA: Insufficient documentation

## 2013-01-18 MED ORDER — HYDROCODONE-ACETAMINOPHEN 5-325 MG PO TABS: 2.0000 | ORAL_TABLET | ORAL | Status: DC | PRN

## 2013-01-18 MED ORDER — HYDROCODONE-ACETAMINOPHEN 5-325 MG PO TABS
1.0000 | ORAL_TABLET | Freq: Once | ORAL | Status: AC
  Administered 2013-01-18: 1 via ORAL
  Filled 2013-01-18: qty 1
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
## 2013-04-21 ENCOUNTER — Emergency Department (HOSPITAL_COMMUNITY)
Admission: EM | Admit: 2013-04-21 | Discharge: 2013-04-21 | Disposition: A | Discharge status: Home / Self Care | Payer: Medicaid Other | Attending: Emergency Medicine | Admitting: Emergency Medicine

## 2013-04-21 ENCOUNTER — Encounter (HOSPITAL_COMMUNITY): Payer: Self-pay

## 2013-04-21 ENCOUNTER — Emergency Department (HOSPITAL_COMMUNITY): Payer: Medicaid Other

## 2013-04-21 DIAGNOSIS — F17200 Nicotine dependence, unspecified, uncomplicated: Secondary | ICD-10-CM | POA: Insufficient documentation

## 2013-04-21 DIAGNOSIS — Z3202 Encounter for pregnancy test, result negative: Secondary | ICD-10-CM | POA: Insufficient documentation

## 2013-04-21 DIAGNOSIS — J45909 Unspecified asthma, uncomplicated: Secondary | ICD-10-CM | POA: Insufficient documentation

## 2013-04-21 DIAGNOSIS — F10221 Alcohol dependence with intoxication delirium: Secondary | ICD-10-CM

## 2013-04-21 DIAGNOSIS — F259 Schizoaffective disorder, unspecified: Secondary | ICD-10-CM | POA: Insufficient documentation

## 2013-04-21 DIAGNOSIS — F10229 Alcohol dependence with intoxication, unspecified: Secondary | ICD-10-CM | POA: Insufficient documentation

## 2013-04-21 DIAGNOSIS — F319 Bipolar disorder, unspecified: Secondary | ICD-10-CM | POA: Insufficient documentation

## 2013-04-21 DIAGNOSIS — R4182 Altered mental status, unspecified: Secondary | ICD-10-CM | POA: Insufficient documentation

## 2013-04-21 DIAGNOSIS — Z79899 Other long term (current) drug therapy: Secondary | ICD-10-CM | POA: Insufficient documentation

## 2013-04-21 HISTORY — DX: Suicide attempt, initial encounter: T14.91XA

## 2013-04-21 LAB — COMPREHENSIVE METABOLIC PANEL
ALT: 19 U/L (ref 0–35)
AST: 29 U/L (ref 0–37)
CO2: 18 mEq/L — ABNORMAL LOW (ref 19–32)
Chloride: 105 mEq/L (ref 96–112)
GFR calc non Af Amer: 84 mL/min — ABNORMAL LOW (ref >90)
Potassium: 3.6 mEq/L (ref 3.5–5.1)
Sodium: 139 mEq/L (ref 135–145)
Total Bilirubin: 0.2 mg/dL — ABNORMAL LOW (ref 0.3–1.2)

## 2013-04-21 LAB — CBC
Platelets: 202 10*3/uL (ref 150–400)
RBC: 4.54 MIL/uL (ref 3.87–5.11)
WBC: 9 10*3/uL (ref 4.0–10.5)

## 2013-04-21 LAB — RAPID URINE DRUG SCREEN, HOSP PERFORMED
Amphetamines: NOT DETECTED
Opiates: NOT DETECTED
Tetrahydrocannabinol: POSITIVE — AB

## 2013-04-21 LAB — URINALYSIS, ROUTINE W REFLEX MICROSCOPIC
Protein, ur: NEGATIVE mg/dL
Urobilinogen, UA: 0.2 mg/dL (ref 0.0–1.0)

## 2013-04-21 LAB — URINE MICROSCOPIC-ADD ON

## 2013-04-21 MED ORDER — HALOPERIDOL LACTATE 5 MG/ML IJ SOLN: 5.0000 mg | Freq: Four times a day (QID) | INTRAMUSCULAR | Status: DC | PRN

## 2013-04-21 MED ORDER — LORAZEPAM 1 MG PO TABS: 1.0000 mg | ORAL_TABLET | Freq: Three times a day (TID) | ORAL | Status: DC | PRN

## 2013-04-21 MED ORDER — ACETAMINOPHEN 325 MG PO TABS
650.0000 mg | ORAL_TABLET | ORAL | Status: DC | PRN
  Administered 2013-04-21: 650 mg via ORAL
  Filled 2013-04-21: qty 2

## 2013-04-21 NOTE — ED Notes (Signed)
House coverage called for sitter because IVC orders.
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
## 2013-04-21 NOTE — ED Provider Notes (Signed)
9:52 AM Pt was seen and evaluated by Dr Norlene Campbell and Dr Ignacia Palma. Small possible abnormality noted on head CT. Case was discussed with Dr Danielle Dess, neurosurgery, and this was thought to be not significant. No use of anticoagulants. Cleared for psych ER  Lyanne Co, MD 04/21/13 [phone]
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
## 2013-04-21 NOTE — Progress Notes (Signed)
8:11 AM Pt's care assumed from Dr. Norlene Campbell at 7 A.M.   Exam of pt now shows she is sleeping peacefully.  Results for orders placed during the hospital encounter of 04/21/13  ACETAMINOPHEN LEVEL      Result Value Range   Acetaminophen (Tylenol), Serum <15.0  10 - 30 ug/mL  CBC      Result Value Range   WBC 9.0  4.0 - 10.5 K/uL   RBC 4.54  3.87 - 5.11 MIL/uL   Hemoglobin 12.5  12.0 - 15.0 g/dL   HCT 60.4  54.0 - 98.1 %   MCV 83.5  78.0 - 100.0 fL   MCH 27.5  26.0 - 34.0 pg   MCHC 33.0  30.0 - 36.0 g/dL   RDW 19.1 (*) 47.8 - 29.5 %   Platelets 202  150 - 400 K/uL  COMPREHENSIVE METABOLIC PANEL      Result Value Range   Sodium 139  135 - 145 mEq/L   Potassium 3.6  3.5 - 5.1 mEq/L   Chloride 105  96 - 112 mEq/L   CO2 18 (*) 19 - 32 mEq/L   Glucose, Bld 79  70 - 99 mg/dL   BUN 9  6 - 23 mg/dL   Creatinine, Ser 6.21  0.50 - 1.10 mg/dL   Calcium 8.8  8.4 - 30.8 mg/dL   Total Protein 7.2  6.0 - 8.3 g/dL   Albumin 3.6  3.5 - 5.2 g/dL   AST 29  0 - 37 U/L   ALT 19  0 - 35 U/L   Alkaline Phosphatase 75  39 - 117 U/L   Total Bilirubin 0.2 (*) 0.3 - 1.2 mg/dL   GFR calc non Af Amer 84 (*) >90 mL/min   GFR calc Af Amer >90  >90 mL/min  ETHANOL      Result Value Range   Alcohol, Ethyl (B) 189 (*) 0 - 11 mg/dL  SALICYLATE LEVEL      Result Value Range   Salicylate Lvl <2.0 (*) 2.8 - 20.0 mg/dL  URINE RAPID DRUG SCREEN (HOSP PERFORMED)      Result Value Range   Opiates NONE DETECTED  NONE DETECTED   Cocaine NONE DETECTED  NONE DETECTED   Benzodiazepines NONE DETECTED  NONE DETECTED   Amphetamines NONE DETECTED  NONE DETECTED   Tetrahydrocannabinol POSITIVE (*) NONE DETECTED   Barbiturates NONE DETECTED  NONE DETECTED  URINALYSIS, ROUTINE W REFLEX MICROSCOPIC      Result Value Range   Color, Urine STRAW (*) YELLOW   APPearance CLEAR  CLEAR   Specific Gravity, Urine 1.010  1.005 - 1.030   pH 6.0  5.0 - 8.0   Glucose, UA NEGATIVE  NEGATIVE mg/dL   Hgb urine dipstick SMALL (*)  NEGATIVE   Bilirubin Urine NEGATIVE  NEGATIVE   Ketones, ur NEGATIVE  NEGATIVE mg/dL   Protein, ur NEGATIVE  NEGATIVE mg/dL   Urobilinogen, UA 0.2  0.0 - 1.0 mg/dL   Nitrite NEGATIVE  NEGATIVE   Leukocytes, UA NEGATIVE  NEGATIVE  URINE MICROSCOPIC-ADD ON      Result Value Range   Squamous Epithelial / LPF FEW (*) RARE   WBC, UA 0-2  <3 WBC/hpf   RBC / HPF 0-2  <3 RBC/hpf   Bacteria, UA FEW (*) RARE   Casts HYALINE CASTS (*) NEGATIVE  POCT PREGNANCY, URINE      Result Value Range   Preg Test, Ur NEGATIVE  NEGATIVE   Ct Head Wo Contrast  04/21/2013   *RADIOLOGY REPORT*  Clinical Data: Head injury with altered mental status  CT HEAD WITHOUT CONTRAST  Technique:  Contiguous axial images were obtained from the base of the skull through the vertex without contrast.  Comparison: None.  Findings: The ventricles are normal in size and configuration. There is a focal area of increased attenuation just superior to the left orbital apex in the lateral left subfrontal region measuring 1.2 x 0.8 cm which is concerning for a focal area of hemorrhagic contusion.  This area does not appear to represent volume averaging from adjacent bone.  No other foci is suspicious for hemorrhage identified.  No mass, extra-axial fluid collection, or midline shift is appreciable. Elsewhere, gray-white compartments are normal.  Bony calvarium appears intact with the mastoid air cells are clear. There is lateral right sphenoid sinus disease.  IMPRESSION: Area suspicious for focal hemorrhagic contusion in the lateral left subfrontal region, seen on slice 6.  No other areas suspicious for potential hemorrhage.  Study otherwise unremarkable except for lateral right sphenoid sinus disease.   Original Report Authenticated By: Bretta Bang, M.D.    Pertinent finding on her lab evaluation were possible hemorrhagic contusion of left frontal region, ETOH 189, UDS positive for marijuana.  8:43 AM Spoke with Barnett Abu, M.D., on  call for neurosurgery.  He will review pt's films and advise.  9:07 AM Dr. Danielle Dess reviewed pt's head CT and did not think the CT finding was clinically significant.  She is medically cleared for psychiatric treatment.  Ordered moved to Pod C.  [facility] called.

## 2013-04-21 NOTE — [facility] Counselor (Signed)
[facility] Team attempted to assess the patient but was unsuccessful because the patient was given medication earlier.

## 2013-04-21 NOTE — Discharge Instructions (Signed)
 Alcohol Intoxication  You have alcohol intoxication when the amount of alcohol that you have consumed has impaired your ability to mentally and physically function. There are a variety of factors that contribute to the level at which alcohol intoxication can occur, such as age, gender, weight, frequency of alcohol consumption, medication use, and the presence of other medical conditions, such as diabetes, seizures, or heart conditions.  The blood alcohol level test measures the concentration of alcohol in your blood. In most states, your blood alcohol level must be lower than 80 mg/dL (0.34%) to legally drive. However, many dangerous effects of alcohol can occur at much lower levels.  Alcohol directly impairs the normal chemical activity of the brain and is said to be a chemical depressant. Alcohol can cause drowsiness, stupor, respiratory failure, and coma. Other physical effects can include headache, vomiting, vomiting of blood, abdominal pain, a fast heartbeat, difficulty breathing, anxiety, and amnesia. Alcohol intoxication can also lead to dangerous and life-threatening activities, such as fighting, dangerous operation of vehicles or heavy machinery, and risky sexual behavior.  Alcohol can be especially dangerous when taken with other drugs. Some of these drugs are:   Sedatives.   Painkillers.   Marijuana.   Tranquilizers.   Antihistamines.   Muscle relaxants.   Seizure medicine.  Many of the effects of acute alcohol intoxication are temporary. However, repeated alcohol intoxication can lead to severe medical illnesses. If you have alcohol intoxication, you should:   Stay hydrated. Drink enough water and fluids to keep your urine clear or pale yellow. Avoid excessive caffeine because this can further lead to dehydration.   Eat a healthy diet. You may have residual nausea, headache, and loss of appetite, but it is still important that you maintain good nutrition. You can start with clear  liquids.   Take nonsteroidal anti-inflammatory medications as needed for headaches, but make sure to do so with small meals. You should avoid acetaminophen for several days after having alcohol intoxication because the combination of alcohol and acetaminophen can be toxic to your liver.  If you have frequent alcohol intoxication, ask your friends and family if they think you have a drinking problem. For further help, contact:   Your caregiver.   Alcoholics Anonymous (AA).   A drug or alcohol rehabilitation program.  SEEK MEDICAL CARE IF:    You have persistent vomiting.   You have persistent pain in any part of your body.   You do not feel better after a few days.  SEEK IMMEDIATE MEDICAL CARE IF:    You become shaky or tremble when you try to stop drinking.   You shake uncontrollably (seizure).   You throw up (vomit) blood. This may be bright red or it may look like black coffee grounds.   You have blood in the stool. This may be bright red or appear as a black, tarry, bad smelling stool.   You become lightheaded or faint.  ANY OF THESE SYMPTOMS MAY REPRESENT A SERIOUS PROBLEM THAT IS AN EMERGENCY. Do not wait to see if the symptoms will go away. Get medical help right away. Call your local emergency services (911 in [place] DO NOT drive yourself to the hospital.  MAKE SURE YOU:    Understand these instructions.   Will watch your condition.   Will get help right away if you are not doing well or get worse.  Document Released: 06/22/2005 Document Revised: 12/05/2011 Document Reviewed: 03/01/2010  [facility] Patient Information 2014 [place], [place].
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
## 2013-06-23 ENCOUNTER — Encounter (HOSPITAL_COMMUNITY): Payer: Self-pay | Admitting: *Deleted

## 2013-06-23 ENCOUNTER — Emergency Department (HOSPITAL_COMMUNITY): Payer: Medicaid Other

## 2013-06-23 ENCOUNTER — Emergency Department (HOSPITAL_COMMUNITY)
Admission: EM | Admit: 2013-06-23 | Discharge: 2013-06-23 | Disposition: A | Discharge status: Home / Self Care | Payer: Medicaid Other | Attending: Emergency Medicine | Admitting: Emergency Medicine

## 2013-06-23 DIAGNOSIS — Z79899 Other long term (current) drug therapy: Secondary | ICD-10-CM | POA: Insufficient documentation

## 2013-06-23 DIAGNOSIS — J45901 Unspecified asthma with (acute) exacerbation: Secondary | ICD-10-CM

## 2013-06-23 DIAGNOSIS — J3489 Other specified disorders of nose and nasal sinuses: Secondary | ICD-10-CM | POA: Insufficient documentation

## 2013-06-23 DIAGNOSIS — F319 Bipolar disorder, unspecified: Secondary | ICD-10-CM | POA: Insufficient documentation

## 2013-06-23 DIAGNOSIS — J069 Acute upper respiratory infection, unspecified: Secondary | ICD-10-CM | POA: Insufficient documentation

## 2013-06-23 DIAGNOSIS — F17200 Nicotine dependence, unspecified, uncomplicated: Secondary | ICD-10-CM | POA: Insufficient documentation

## 2013-06-23 DIAGNOSIS — R51 Headache: Secondary | ICD-10-CM | POA: Insufficient documentation

## 2013-06-23 LAB — RAPID STREP SCREEN (MED CTR MEBANE ONLY): Streptococcus, Group A Screen (Direct): NEGATIVE

## 2013-06-23 MED ORDER — PREDNISONE 20 MG PO TABS: 60.0000 mg | ORAL_TABLET | Freq: Once | ORAL | Status: DC

## 2013-06-23 MED ORDER — PREDNISONE 20 MG PO TABS
60.0000 mg | ORAL_TABLET | Freq: Once | ORAL | Status: AC
  Administered 2013-06-23: 60 mg via ORAL
  Filled 2013-06-23: qty 3

## 2013-06-23 MED ORDER — IBUPROFEN 400 MG PO TABS
600.0000 mg | ORAL_TABLET | Freq: Once | ORAL | Status: AC
  Administered 2013-06-23: 600 mg via ORAL
  Filled 2013-06-23 (×2): qty 1

## 2013-06-23 MED ORDER — IBUPROFEN 600 MG PO TABS: 600.0000 mg | ORAL_TABLET | Freq: Four times a day (QID) | ORAL | Status: DC | PRN

## 2013-06-23 MED ORDER — ALBUTEROL SULFATE HFA 108 (90 BASE) MCG/ACT IN AERS
6.0000 | INHALATION_SPRAY | Freq: Once | RESPIRATORY_TRACT | Status: AC
  Administered 2013-06-23: 6 via RESPIRATORY_TRACT
  Filled 2013-06-23: qty 6.7

## 2013-06-23 NOTE — ED Notes (Signed)
Patient with 2 days of coughing, scratching throat, and headache.  She has tried nyquil at home.

## 2013-06-23 NOTE — ED Provider Notes (Signed)
CSN: [id number]     Arrival date & time 06/23/13  1016 History   First MD Initiated Contact with Patient 06/23/13 1031     Chief Complaint  Patient presents with  . Cough  . Headache  . Sore Throat   (Consider location/radiation/quality/duration/timing/severity/associated sxs/prior Treatment) HPI Comments: Patient with sore throat is worse with swallowing. No medications have been taken. No history of trauma. Patient has history of asthma however is out of albuterol at home.  Patient is a 22 y.o. female presenting with cough, headaches, and pharyngitis. The history is provided by the patient.  Cough Cough characteristics:  Productive Sputum characteristics:  Nondescript Severity:  Moderate Onset quality:  Sudden Duration:  2 days Timing:  Intermittent Progression:  Waxing and waning Chronicity:  New Smoker: yes   Context: sick contacts   Relieved by:  Nothing Worsened by:  Nothing tried Ineffective treatments:  None tried Associated symptoms: fever, headaches, rhinorrhea, sore throat and wheezing   Risk factors: no recent infection   Headache Associated symptoms: cough, fever and sore throat   Sore Throat Associated symptoms include headaches.    Past Medical History  Diagnosis Date  . Asthma   . Bipolar disorder   . Suicide attempt    Past Surgical History  Procedure Laterality Date  . No past surgeries     No family history on file. History  Substance Use Topics  . Smoking status: Current Every Day Smoker -- 0.50 packs/day  . Smokeless tobacco: Not on file  . Alcohol Use: No   OB History   Grav Para Term Preterm Abortions TAB SAB Ect Mult Living   2 1 1  1 1    1      Review of Systems  Constitutional: Positive for fever.  HENT: Positive for sore throat and rhinorrhea.   Respiratory: Positive for cough and wheezing.   Neurological: Positive for headaches.  All other systems reviewed and are negative.    Allergies  Review of patient's allergies  indicates no known allergies.  Home Medications   Current Outpatient Rx  Name  Route  Sig  Dispense  Refill  . buPROPion (WELLBUTRIN XL) 150 MG 24 hr tablet   Oral   Take 150 mg by mouth daily.         . Pseudoeph-Doxylamine-DM-APAP (NYQUIL PO)   Oral   Take by mouth. Unsure of amount taken          BP 133/68  Pulse 74  Temp(Src) 98 F (36.7 C) (Oral)  Resp 18  Wt 213 lb 11.2 oz (96.934 kg)  BMI 35.56 kg/m2  SpO2 99% Physical Exam  Nursing note and vitals reviewed. Constitutional: She is oriented to person, place, and time. She appears well-developed and well-nourished.  HENT:  Head: Normocephalic.  Right Ear: External ear normal.  Left Ear: External ear normal.  Nose: Nose normal.  Mouth/Throat: Oropharynx is clear and moist.  Uvula midline mild tonsilar erythema  Eyes: EOM are normal. Pupils are equal, round, and reactive to light. Right eye exhibits no discharge. Left eye exhibits no discharge.  Neck: Normal range of motion. Neck supple. No tracheal deviation present.  No nuchal rigidity no meningeal signs  Cardiovascular: Normal rate and regular rhythm.   Pulmonary/Chest: Effort normal. No stridor. No respiratory distress. She has wheezes. She has no rales. She exhibits no tenderness.  Abdominal: Soft. She exhibits no distension and no mass. There is no tenderness. There is no rebound and no guarding.  Musculoskeletal: Normal  range of motion. She exhibits no edema and no tenderness.  Neurological: She is alert and oriented to person, place, and time. She has normal reflexes. No cranial nerve deficit. Coordination normal.  Skin: Skin is warm. No rash noted. She is not diaphoretic. No erythema. No pallor.  No pettechia no purpura    ED Course  Procedures (including critical care time) Labs Review Labs Reviewed  RAPID STREP SCREEN  CULTURE, GROUP A STREP   Imaging Review Dg Chest 2 View  06/23/2013   *RADIOLOGY REPORT*  Clinical Data: Cough, sore throat   CHEST - 2 VIEW  Comparison: Prior chest x-ray 01/18/2013  Findings: The lungs are well-aerated and free from pulmonary edema, focal airspace consolidation or pulmonary nodule.  Cardiac and mediastinal contours are within normal limits.  No pneumothorax, or pleural effusion. No acute osseous findings.  IMPRESSION:  No acute cardiopulmonary disease.   Original Report Authenticated By: Malachy Moan, M.D.    MDM   1. Asthma exacerbation   2. URI (upper respiratory infection)      Patient with known history of asthma now with wheezing on exam and URI symptoms. We'll give albuterol inhaler and reevaluate. Also obtain chest x-ray to rule out pneumonia. No abdominal tenderness to suggest appendicitis, no dysuria to suggest urinary tract infection, no nuchal rigidity or toxicity to suggest meningitis. I will check strep throat to rule out strep throat. Uvula midline making peritonsillar abscess unlikely. Patient agrees with plan    1120a chest x-ray on my review shows no evidence of acute pneumonia, strep throat screen is negative as well. Patient with clear breath sounds bilaterally after albuterol treatment. Will load patient on oral steroids and discharge home to continue a four-day course of steroids. At time of discharge home patient had clear breath sounds bilaterally no tachypnea no distress and is nontoxic appearing. Patient agrees with plan.  Arley Phenix, MD 06/23/13 1120

## 2013-06-25 LAB — CULTURE, GROUP A STREP

## 2013-08-26 ENCOUNTER — Emergency Department (INDEPENDENT_AMBULATORY_CARE_PROVIDER_SITE_OTHER)
Admission: EM | Admit: 2013-08-26 | Discharge: 2013-08-26 | Disposition: A | Discharge status: Home / Self Care | Payer: Medicaid Other | Source: Home / Self Care | Attending: Emergency Medicine | Admitting: Emergency Medicine

## 2013-08-26 ENCOUNTER — Encounter (HOSPITAL_COMMUNITY): Payer: Self-pay | Admitting: Emergency Medicine

## 2013-08-26 DIAGNOSIS — I776 Arteritis, unspecified: Secondary | ICD-10-CM

## 2013-08-26 DIAGNOSIS — M7989 Other specified soft tissue disorders: Secondary | ICD-10-CM

## 2013-08-26 DIAGNOSIS — R21 Rash and other nonspecific skin eruption: Secondary | ICD-10-CM

## 2013-08-26 MED ORDER — DEXAMETHASONE SODIUM PHOSPHATE 10 MG/ML IJ SOLN
INTRAMUSCULAR | Status: AC
  Filled 2013-08-26: qty 1

## 2013-08-26 MED ORDER — OXYCODONE-ACETAMINOPHEN 7.5-325 MG PO TABS: 1.0000 | ORAL_TABLET | ORAL | Status: DC | PRN

## 2013-08-26 MED ORDER — HYDROCODONE-ACETAMINOPHEN 5-325 MG PO TABS
ORAL_TABLET | ORAL | Status: AC
  Filled 2013-08-26: qty 2

## 2013-08-26 MED ORDER — PREDNISONE 20 MG PO TABS: ORAL_TABLET | ORAL | Status: DC

## 2013-08-26 MED ORDER — HYDROCODONE-ACETAMINOPHEN 5-325 MG PO TABS
2.0000 | ORAL_TABLET | Freq: Once | ORAL | Status: AC
  Administered 2013-08-26: 2 via ORAL

## 2013-08-26 MED ORDER — DEXAMETHASONE SODIUM PHOSPHATE 10 MG/ML IJ SOLN
10.0000 mg | Freq: Once | INTRAMUSCULAR | Status: AC
  Administered 2013-08-26: 10 mg via INTRAMUSCULAR

## 2013-08-26 NOTE — ED Provider Notes (Signed)
Medical screening examination/treatment/procedure(s) were performed by non-physician practitioner and as supervising physician I was immediately available for consultation/collaboration.  Berlinda Farve, M.D.  Royce Sciara C Debi Cousin, MD 08/26/13 2342 

## 2013-08-26 NOTE — ED Notes (Signed)
C/o rash that is covering both legs from inner thighs all the way down legs.  States legs became irritated after wearing a new pair of leggings.  States that she has not wore them since and rash has not progressed.  Onset 4 days ago.   Pt is also c/o swelling in legs and feet with severe pain that began shortly after rash. Having severe pain with walking.  Pt has tried otc pain meds with no relief.

## 2013-08-26 NOTE — ED Provider Notes (Signed)
CSN: [id number]     Arrival date & time 08/26/13  4098 History   None    Chief Complaint  Patient presents with  . Rash  . Leg Pain   (Consider location/radiation/quality/duration/timing/severity/associated sxs/prior Treatment) HPI Comments: 22 year old female presents complaining of rash, leg pain, swelling. This began as a rash on her thighs 4 days ago after wearing some new leggings. This rash resolved when she got rid of the leggings. Starting the next day, she began to have a new rash, pain, and swelling on her bilateral lower extremity. This pain has gotten progressively worse. Is now so severe that she cannot walk. Her skin is very sensitive and she has not been able to wear socks or put anything on her feet today. She has never had this before. She does not take any medicines. No history of DVT or PE. No other symptoms apart from those in the lower legs today.  Patient is a 21 y.o. female presenting with rash and leg pain.  Rash Associated symptoms: no chest pain, no chills, no cough, no dysuria, no fever, no nausea, no shortness of breath and no vomiting   Leg Pain Associated symptoms: no fever     Past Medical History  Diagnosis Date  . Asthma   . Bipolar disorder   . Suicide attempt    Past Surgical History  Procedure Laterality Date  . No past surgeries     History reviewed. No pertinent family history. History  Substance Use Topics  . Smoking status: Current Every Day Smoker -- 0.50 packs/day  . Smokeless tobacco: Not on file  . Alcohol Use: No   OB History   Grav Para Term Preterm Abortions TAB SAB Ect Mult Living   2 1 1  1 1    1      Review of Systems  Constitutional: Negative for fever and chills.  Eyes: Negative for visual disturbance.  Respiratory: Negative for cough and shortness of breath.   Cardiovascular: Positive for leg swelling. Negative for chest pain and palpitations.  Gastrointestinal: Negative for nausea, vomiting and abdominal pain.   Endocrine: Negative for polydipsia and polyuria.  Genitourinary: Negative for dysuria, urgency and frequency.  Musculoskeletal: Negative for arthralgias and myalgias.       Leg pain  Skin: Positive for rash.  Neurological: Negative for dizziness, weakness and light-headedness.    Allergies  Review of patient's allergies indicates no known allergies.  Home Medications   Current Outpatient Rx  Name  Route  Sig  Dispense  Refill  . ibuprofen (ADVIL,MOTRIN) 600 MG tablet   Oral   Take 1 tablet (600 mg total) by mouth every 6 (six) hours as needed for pain or fever.   30 tablet   0   . Pseudoeph-Doxylamine-DM-APAP (NYQUIL PO)   Oral   Take by mouth. Unsure of amount taken         . buPROPion (WELLBUTRIN XL) 150 MG 24 hr tablet   Oral   Take 150 mg by mouth daily.         Marland Kitchen oxyCODONE-acetaminophen (PERCOCET) 7.5-325 MG per tablet   Oral   Take 1 tablet by mouth every 4 (four) hours as needed for pain.   30 tablet   0   . predniSONE (DELTASONE) 20 MG tablet   Oral   Take 3 tablets (60 mg total) by mouth once. 60mg  po qday x 4 days qs   12 tablet   0   . predniSONE (DELTASONE) 20 MG  tablet      3 tabs PO QD for 7 days;  2 tabs PO QD for 7 days;  1 tab PO QD until tapered further by PCP or dermatologist   49 tablet   0    BP 122/73  Pulse 84  Temp(Src) 98.5 F (36.9 C) (Oral)  Resp 16  SpO2 100%  LMP 08/12/2013 Physical Exam  Nursing note and vitals reviewed. Constitutional: She is oriented to person, place, and time. Vital signs are normal. She appears well-developed and well-nourished. No distress.  HENT:  Head: Normocephalic and atraumatic.  Pulmonary/Chest: Effort normal. No respiratory distress.  Musculoskeletal:  Bilateral lower extremity below the knees: Edema/swelling, exquisitely tender. Pulses are 2+ bilaterally. Capillary refill intact.  Neurological: She is alert and oriented to person, place, and time. She has normal strength. Coordination  normal.  Skin: Skin is warm and dry. Rash noted. Rash is macular (Purpuric macules on the bilateral lower extremity up to the mid thigh). She is not diaphoretic.  Psychiatric: She has a normal mood and affect. Judgment normal.    ED Course  Procedures (including critical care time) Labs Review Labs Reviewed - No data to display Imaging Review No results found.    MDM   1. Leg swelling   2. Rash   3. Vasculitis    Patient seen in conjunction with the attending Dr. Lorenz Coaster. 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 Treating with prednisone and narcotic pain medicine. We will have her followup with dermatology.   Meds ordered this encounter  Medications  . HYDROcodone-acetaminophen (NORCO/VICODIN) 5-325 MG per tablet 2 tablet    Sig:   . dexamethasone (DECADRON) injection 10 mg    Sig:   . predniSONE (DELTASONE) 20 MG tablet    Sig: 3 tabs PO QD for 7 days;  2 tabs PO QD for 7 days;  1 tab PO QD until tapered further by PCP or dermatologist    Dispense:  49 tablet    Refill:  0    Order Specific Question:  Supervising Provider    Answer:  Lorenz Coaster, DAVID C [id number]  . oxyCODONE-acetaminophen (PERCOCET) 7.5-325 MG per tablet    Sig: Take 1 tablet by mouth every 4 (four) hours as needed for pain.    Dispense:  30 tablet    Refill:  0    Order Specific Question:  Supervising Provider    Answer:  Lorenz Coaster, DAVID C [[id number]]      Graylon Good, PA-C 08/26/13 1008

## 2013-08-31 ENCOUNTER — Emergency Department (HOSPITAL_COMMUNITY)
Admission: EM | Admit: 2013-08-31 | Discharge: 2013-09-01 | Disposition: A | Discharge status: Home / Self Care | Payer: Medicaid Other | Attending: Emergency Medicine | Admitting: Emergency Medicine

## 2013-08-31 ENCOUNTER — Encounter (HOSPITAL_COMMUNITY): Payer: Self-pay | Admitting: Emergency Medicine

## 2013-08-31 DIAGNOSIS — Z9119 Patient's noncompliance with other medical treatment and regimen: Secondary | ICD-10-CM | POA: Insufficient documentation

## 2013-08-31 DIAGNOSIS — L819 Disorder of pigmentation, unspecified: Secondary | ICD-10-CM | POA: Insufficient documentation

## 2013-08-31 DIAGNOSIS — F319 Bipolar disorder, unspecified: Secondary | ICD-10-CM | POA: Insufficient documentation

## 2013-08-31 DIAGNOSIS — R21 Rash and other nonspecific skin eruption: Secondary | ICD-10-CM | POA: Insufficient documentation

## 2013-08-31 DIAGNOSIS — J45909 Unspecified asthma, uncomplicated: Secondary | ICD-10-CM | POA: Insufficient documentation

## 2013-08-31 DIAGNOSIS — Z91199 Patient's noncompliance with other medical treatment and regimen due to unspecified reason: Secondary | ICD-10-CM | POA: Insufficient documentation

## 2013-08-31 DIAGNOSIS — Z79899 Other long term (current) drug therapy: Secondary | ICD-10-CM | POA: Insufficient documentation

## 2013-08-31 DIAGNOSIS — IMO0002 Reserved for concepts with insufficient information to code with codable children: Secondary | ICD-10-CM | POA: Insufficient documentation

## 2013-08-31 DIAGNOSIS — F17200 Nicotine dependence, unspecified, uncomplicated: Secondary | ICD-10-CM | POA: Insufficient documentation

## 2013-08-31 MED ORDER — IBUPROFEN 800 MG PO TABS
800.0000 mg | ORAL_TABLET | Freq: Once | ORAL | Status: AC
  Administered 2013-08-31: 800 mg via ORAL
  Filled 2013-08-31: qty 1

## 2013-08-31 MED ORDER — IBUPROFEN 800 MG PO TABS: 800.0000 mg | ORAL_TABLET | Freq: Three times a day (TID) | ORAL | Status: DC

## 2013-08-31 NOTE — ED Notes (Signed)
Pt arrived to the ED with a complaint right ankle pain.  Pt was seen last week for the same at Urgent Care.  Pt given pain medicine and prednisone.  Pt took two pf the prednisone and stopped and took no pain medication.  Pt also had a tattoo about a month ago which has raise bumps covering the tattoo.  Pt also has a rash on her leg.  Pt stated that pain, swelling, and rash had gone away and suddenly returned at 1930  Hrs this evening.

## 2013-08-31 NOTE — ED Notes (Signed)
Bed: WTR9 Expected date:  Expected time:  Means of arrival:  Comments: EMS ankle swelling / bumps

## 2013-08-31 NOTE — ED Provider Notes (Signed)
CSN: [id number]     Arrival date & time 08/31/13  2228 History  This chart was scribed for non-physician practitioner, Kyung Bacca, PA-C,working with Suzi Roots, MD, by Karle Plumber, ED Scribe.  This patient was seen in room WTR9/WTR9 and the patient's care was started at 11:14 PM.  Chief Complaint  Patient presents with  . Ankle Pain   The history is provided by the patient. No language interpreter was used.   HPI Comments:  Erin Hernandez is a 22 y.o. female brought in by ambulance, who presents to the Emergency Department complaining of right ankle pain x approximately one week. Pt states she presented to the urgent care last week and was given pain medication and prednisone. She states the pain medication is too strong so she did not take it and the prednisone made her feel jittery so she did not take it either. She reports the swelling resolved after visiting the urgent care and receiving IM decadron.  She reports her right ankle began swelling/hurting again starting about four hours ago. She states elevating her foot and bearing weight makes the pain in her ankle worse.  Denies trauma.  No associated fever.   Pt also complains of a scattered rash on her lower extremities for approximately one week. She states she was given a dermatologist referral at urgent care, but did not follow up because the rash resolved. She states the rash returned earlier today as well. She states she also has bumps on her back around a tattoo that she received about one month ago. She reports noticing the bumps about 2.5 weeks ago. She denies itching or pain with the rash.   Past Medical History  Diagnosis Date  . Asthma   . Bipolar disorder   . Suicide attempt    Past Surgical History  Procedure Laterality Date  . No past surgeries     No family history on file. History  Substance Use Topics  . Smoking status: Current Every Day Smoker -- 0.50 packs/day  . Smokeless tobacco: Not on file  .  Alcohol Use: No   OB History   Grav Para Term Preterm Abortions TAB SAB Ect Mult Living   2 1 1  1 1    1      Review of Systems  All other systems reviewed and are negative.    Allergies  Review of patient's allergies indicates no known allergies.  Home Medications   Current Outpatient Rx  Name  Route  Sig  Dispense  Refill  . buPROPion (WELLBUTRIN XL) 150 MG 24 hr tablet   Oral   Take 150 mg by mouth daily.         Marland Kitchen ibuprofen (ADVIL,MOTRIN) 600 MG tablet   Oral   Take 1 tablet (600 mg total) by mouth every 6 (six) hours as needed for pain or fever.   30 tablet   0   . oxyCODONE-acetaminophen (PERCOCET) 7.5-325 MG per tablet   Oral   Take 1 tablet by mouth every 4 (four) hours as needed for pain.   30 tablet   0   . predniSONE (DELTASONE) 20 MG tablet   Oral   Take 3 tablets (60 mg total) by mouth once. 60mg  po qday x 4 days qs   12 tablet   0   . predniSONE (DELTASONE) 20 MG tablet      3 tabs PO QD for 7 days;  2 tabs PO QD for 7 days;  1 tab  PO QD until tapered further by PCP or dermatologist   49 tablet   0   . Pseudoeph-Doxylamine-DM-APAP (NYQUIL PO)   Oral   Take by mouth. Unsure of amount taken          Triage Vitals: BP 114/80  Pulse 78  Temp(Src) 98.5 F (36.9 C) (Oral)  Resp 20  SpO2 100%  LMP 08/12/2013 Physical Exam  Nursing note and vitals reviewed. Constitutional: She is oriented to person, place, and time. She appears well-developed and well-nourished. No distress.  HENT:  Head: Normocephalic and atraumatic.  Eyes:  Normal appearance  Neck: Normal range of motion.  Pulmonary/Chest: Effort normal.  Musculoskeletal: Normal range of motion.  Several, discrete, raised, erythematous lesions of various sizes bilateral lower legs and feet, sparing soles.  No edema.  Severe tenderness to light palpation on right, particularly in foot and achilles.  Mild-mod tenderness L foot and lower leg.  2+ DP pulse and distal sensation intact.     Neurological: She is alert and oriented to person, place, and time.  Skin:  Large tattoo at center of upper back.  Many, 1-81mm, skin-colored papules along ink lines.  No erythema, edema or ttp.  Psychiatric: She has a normal mood and affect. Her behavior is normal.    ED Course  Procedures (including critical care time) DIAGNOSTIC STUDIES: Oxygen Saturation is 100% on RA, normal by my interpretation.   COORDINATION OF CARE: 11:28 PM- Will talk with Dr. Norlene Campbell about appropriate course of treatment. Pt verbalizes understanding and agrees to plan.  Medications - No data to display  Labs Review Labs Reviewed - No data to display Imaging Review No results found.  EKG Interpretation   None       MDM   1. Rash    Healthy 21yo F presents for the second time w/ painful rash of bilateral lower legs.  UCC diagnosed w/ vasculitis, prescribed vicodin and prednisone and referred to derm.  Pt has been non-compliant w/ meds d/t SE profile, but did not f/u w/ derm b/c sx improved following decadron injection at last visit.  Sx returned this evening.  Exam most consistent w/ erythema nodosum.  Etiology unclear at this time but could be secondary to URI that recently resolved.  Advised f/u with dermatology for further evaluation of underlying cause as well as treatment.  Prescribed 800mg  ibuprofen and recommended elevation and rest in the meantime.  Ortho tech provided her with crutches (RLE worse than LLE).  She also c/o non-pruritic/non-painful rash on upper back, overlying tattoo that she had done 1 month ago.  A friend noticed it yesterday so onset unknown.  Rash does not appear infectious but I suspect that it is a reaction to the ink.  Recommended that she see derm for that problem as well.  Return precautions discussed.     I personally performed the services described in this documentation, which was scribed in my presence. The recorded information has been reviewed and is accurate.    Otilio Miu, PA-C 09/01/13 [phone]

## 2013-09-01 NOTE — ED Provider Notes (Signed)
Medical screening examination/treatment/procedure(s) were conducted as a shared visit with non-physician practitioner(s) and myself.  I personally evaluated the patient during the encounter.  EKG Interpretation   None       Pt c/o sore, painful skin lesions rash to ankle and lower legs. Recent seen at outside facility, told to take pred for same which pt has taken.  On exam, few sparse, scattered lesions felt possibly c/w erythema nodosum. No palms/soles. No mm involvement. No swelling. No cellulitis. Distal pulses palp.   Suzi Roots, MD 09/01/13 1540

## 2013-11-08 ENCOUNTER — Emergency Department (INDEPENDENT_AMBULATORY_CARE_PROVIDER_SITE_OTHER)
Admission: EM | Admit: 2013-11-08 | Discharge: 2013-11-08 | Disposition: A | Discharge status: Home / Self Care | Payer: Medicaid Other | Source: Home / Self Care

## 2013-11-08 ENCOUNTER — Other Ambulatory Visit (HOSPITAL_COMMUNITY)
Admission: RE | Admit: 2013-11-08 | Discharge: 2013-11-08 | Disposition: A | Discharge status: Home / Self Care | Payer: Medicaid Other | Source: Ambulatory Visit | Attending: Family Medicine | Admitting: Family Medicine

## 2013-11-08 ENCOUNTER — Encounter (HOSPITAL_COMMUNITY): Payer: Self-pay | Admitting: Emergency Medicine

## 2013-11-08 DIAGNOSIS — N760 Acute vaginitis: Secondary | ICD-10-CM

## 2013-11-08 DIAGNOSIS — Z113 Encounter for screening for infections with a predominantly sexual mode of transmission: Secondary | ICD-10-CM | POA: Insufficient documentation

## 2013-11-08 LAB — POCT URINALYSIS DIP (DEVICE)
Bilirubin Urine: NEGATIVE
Glucose, UA: NEGATIVE mg/dL
Ketones, ur: NEGATIVE mg/dL
LEUKOCYTES UA: NEGATIVE
NITRITE: NEGATIVE
PH: 7 (ref 5.0–8.0)
Protein, ur: NEGATIVE mg/dL
Specific Gravity, Urine: 1.025 (ref 1.005–1.030)
UROBILINOGEN UA: 0.2 mg/dL (ref 0.0–1.0)

## 2013-11-08 LAB — POCT PREGNANCY, URINE: Preg Test, Ur: NEGATIVE

## 2013-11-08 MED ORDER — METRONIDAZOLE 500 MG PO TABS: 500.0000 mg | ORAL_TABLET | Freq: Two times a day (BID) | ORAL | Status: DC

## 2013-11-08 NOTE — ED Notes (Signed)
C/o vaginal itching that started Sunday. Has an odor, no discharge or pain. Stated that she has changed laundry detergent and soap. Written by: [name], SMA

## 2013-11-08 NOTE — ED Provider Notes (Signed)
Erin [place] is [place] 23 y.o. female who presents to Urgent Care today for vaginal discharge. Patient notes 2 weeks of vaginal discharge associated with itching and odor. She is not as is a like this before. She has not tried any medications. She denies any fevers chills nausea vomiting diarrhea or abdominal pain. She feels well otherwise.   Past Medical History  Diagnosis Date  . Asthma   . Bipolar disorder   . Suicide attempt    History  Substance Use Topics  . Smoking status: Current Every Day Smoker -- 0.50 packs/day  . Smokeless tobacco: Not on file  . Alcohol Use: No   ROS as above Medications: No current facility-administered medications for this encounter.   Current Outpatient Prescriptions  Medication Sig Dispense Refill  . buPROPion (WELLBUTRIN XL) 150 MG 24 hr tablet Take 150 mg by mouth daily.      Marland Kitchen. ibuprofen (ADVIL,MOTRIN) 600 MG tablet Take 1 tablet (600 mg total) by mouth every 6 (six) hours as needed for pain or fever.  30 tablet  0  . ibuprofen (ADVIL,MOTRIN) 800 MG tablet Take 1 tablet (800 mg total) by mouth 3 (three) times daily.  21 tablet  0  . metroNIDAZOLE (FLAGYL) 500 MG tablet Take 1 tablet (500 mg total) by mouth 2 (two) times daily.  14 tablet  0  . Pseudoeph-Doxylamine-DM-APAP (NYQUIL PO) Take by mouth. Unsure of amount taken        Exam:  BP 127/64  Pulse 70  Temp(Src) 97.6 F (36.4 C) (Oral)  Resp 18  SpO2 100%  LMP 10/08/2013 Gen: Well NAD HEENT: EOMI,  MMM Lungs: Normal work of breathing. CTABL Heart: RRR no MRG Abd: NABS, Soft. NT, ND Exts: Brisk capillary refill, warm and well perfused.  GYN: Normal external genitalia. Vaginal canal with thin white discharge. Normal-appearing cervix.  Results for orders placed during the hospital encounter of 11/08/13 (from the past 24 hour(s))  POCT URINALYSIS DIP (DEVICE)     Status: Abnormal   Collection Time    11/08/13  2:12 PM      Result Value Ref Range   Glucose, UA NEGATIVE  NEGATIVE mg/dL    Bilirubin Urine NEGATIVE  NEGATIVE   Ketones, ur NEGATIVE  NEGATIVE mg/dL   Specific Gravity, Urine 1.025  1.005 - 1.030   Hgb urine dipstick TRACE (*) NEGATIVE   pH 7.0  5.0 - 8.0   Protein, ur NEGATIVE  NEGATIVE mg/dL   Urobilinogen, UA 0.2  0.0 - 1.0 mg/dL   Nitrite NEGATIVE  NEGATIVE   Leukocytes, UA NEGATIVE  NEGATIVE   No results found.  Assessment and Plan: 23 y.o. female with vaginitis likely bacterial vaginosis. Plan to treat empirically with metronidazole. Cytology pending. Will call patient with results of abnormal.  Discussed warning signs or symptoms. Please see discharge instructions. Patient expresses understanding.    Rodolph BongEvan S Ashleen Demma, MD 11/08/13 [phone]

## 2013-11-08 NOTE — Discharge Instructions (Signed)
Thank you for coming in today. Take flagyl twice daily for 1 week.  Do not take with alcohol.  We will call you if we need to change treatment.  Call or go to the emergency room if you get worse, have trouble breathing, have chest pains, or palpitations.   Bacterial Vaginosis Bacterial vaginosis is a vaginal infection that occurs when the normal balance of bacteria in the vagina is disrupted. It results from an overgrowth of certain bacteria. This is the most common vaginal infection in women of childbearing age. Treatment is important to prevent complications, especially in pregnant women, as it can cause a premature delivery. CAUSES  Bacterial vaginosis is caused by an increase in harmful bacteria that are normally present in smaller amounts in the vagina. Several different kinds of bacteria can cause bacterial vaginosis. However, the reason that the condition develops is not fully understood. RISK FACTORS Certain activities or behaviors can put you at an increased risk of developing bacterial vaginosis, including:  Having a new sex partner or multiple sex partners.  Douching.  Using an intrauterine device (IUD) for contraception. Women do not get bacterial vaginosis from toilet seats, bedding, swimming pools, or contact with objects around them. SIGNS AND SYMPTOMS  Some women with bacterial vaginosis have no signs or symptoms. Common symptoms include:  Grey vaginal discharge.  A fishlike odor with discharge, especially after sexual intercourse.  Itching or burning of the vagina and vulva.  Burning or pain with urination. DIAGNOSIS  Your health care provider will take a medical history and examine the vagina for signs of bacterial vaginosis. A sample of vaginal fluid may be taken. Your health care provider will look at this sample under a microscope to check for bacteria and abnormal cells. A vaginal pH test may also be done.  TREATMENT  Bacterial vaginosis may be treated with  antibiotic medicines. These may be given in the form of a pill or a vaginal cream. A second round of antibiotics may be prescribed if the condition comes back after treatment.  HOME CARE INSTRUCTIONS   Only take over-the-counter or prescription medicines as directed by your health care provider.  If antibiotic medicine was prescribed, take it as directed. Make sure you finish it even if you start to feel better.  Do not have sex until treatment is completed.  Tell all sexual partners that you have a vaginal infection. They should see their health care provider and be treated if they have problems, such as a mild rash or itching.  Practice safe sex by using condoms and only having one sex partner. SEEK MEDICAL CARE IF:   Your symptoms are not improving after 3 days of treatment.  You have increased discharge or pain.  You have a fever. MAKE SURE YOU:   Understand these instructions.  Will watch your condition.  Will get help right away if you are not doing well or get worse. FOR MORE INFORMATION  Centers for Disease Control and Prevention, Division of STD Prevention: SolutionApps.co.zawww.cdc.gov/std American Sexual Health Association (ASHA): www.ashastd.org  Document Released: 09/12/2005 Document Revised: 07/03/2013 Document Reviewed: 04/24/2013 [facility] Patient Information 2014 [place], [place].

## 2013-11-11 LAB — CERVICOVAGINAL ANCILLARY ONLY
CHLAMYDIA, DNA PROBE: NEGATIVE
Neisseria Gonorrhea: NEGATIVE
WET PREP (BD AFFIRM): POSITIVE — AB
Wet Prep (BD Affirm): NEGATIVE
Wet Prep (BD Affirm): NEGATIVE

## 2013-11-11 MED ORDER — METRONIDAZOLE 500 MG PO TABS
500.0000 mg | ORAL_TABLET | Freq: Two times a day (BID) | ORAL | Status: DC
Start: 2013-11-11 — End: 2014-02-04

## 2013-11-12 ENCOUNTER — Telehealth (HOSPITAL_COMMUNITY): Payer: Self-pay | Admitting: *Deleted

## 2013-11-12 NOTE — ED Notes (Signed)
Pt. called back.  Pt. verified x 2 and given results.  Pt. told she needs Flagyl and where to pick up Rx.   Pt. instructed to no alcohol while taking this medication.  Pt. voiced understanding. Vassie MoselleYork, Ercole Georg M 11/12/2013

## 2013-11-12 NOTE — ED Notes (Addendum)
GC/Chlamydia neg., Affirm: Candida and Trich neg., Gardnerella pos.  Dr. Lorenz CoasterKeller e-prescribed Flagyl 2/16.  I called and left a message for pt. to call.  Call 1. Vassie MoselleYork, [place] M 11/12/2013

## 2014-02-04 ENCOUNTER — Emergency Department (HOSPITAL_COMMUNITY)
Admission: EM | Admit: 2014-02-04 | Discharge: 2014-02-04 | Disposition: A | Discharge status: Home / Self Care | Payer: Medicaid Other | Attending: Emergency Medicine | Admitting: Emergency Medicine

## 2014-02-04 ENCOUNTER — Encounter (HOSPITAL_COMMUNITY): Payer: Self-pay | Admitting: Emergency Medicine

## 2014-02-04 ENCOUNTER — Emergency Department (INDEPENDENT_AMBULATORY_CARE_PROVIDER_SITE_OTHER)
Admission: EM | Admit: 2014-02-04 | Discharge: 2014-02-04 | Disposition: A | Discharge status: Other Acute Inpatient Hospital | Payer: Medicaid Other | Source: Home / Self Care

## 2014-02-04 ENCOUNTER — Emergency Department (HOSPITAL_COMMUNITY): Payer: Medicaid Other

## 2014-02-04 DIAGNOSIS — R1031 Right lower quadrant pain: Secondary | ICD-10-CM

## 2014-02-04 DIAGNOSIS — J45909 Unspecified asthma, uncomplicated: Secondary | ICD-10-CM | POA: Insufficient documentation

## 2014-02-04 DIAGNOSIS — F17200 Nicotine dependence, unspecified, uncomplicated: Secondary | ICD-10-CM | POA: Insufficient documentation

## 2014-02-04 DIAGNOSIS — R52 Pain, unspecified: Secondary | ICD-10-CM

## 2014-02-04 DIAGNOSIS — N7093 Salpingitis and oophoritis, unspecified: Secondary | ICD-10-CM

## 2014-02-04 DIAGNOSIS — Z8659 Personal history of other mental and behavioral disorders: Secondary | ICD-10-CM | POA: Insufficient documentation

## 2014-02-04 LAB — CBC WITH DIFFERENTIAL/PLATELET
Basophils Absolute: 0 10*3/uL (ref 0.0–0.1)
Basophils Relative: 0 % (ref 0–1)
EOS ABS: 0.1 10*3/uL (ref 0.0–0.7)
Eosinophils Relative: 1 % (ref 0–5)
HCT: 38.3 % (ref 36.0–46.0)
Hemoglobin: 13.1 g/dL (ref 12.0–15.0)
Lymphocytes Relative: 26 % (ref 12–46)
Lymphs Abs: 2.7 10*3/uL (ref 0.7–4.0)
MCH: 28.4 pg (ref 26.0–34.0)
MCHC: 34.2 g/dL (ref 30.0–36.0)
MCV: 83.1 fL (ref 78.0–100.0)
MONO ABS: 0.9 10*3/uL (ref 0.1–1.0)
MONOS PCT: 9 % (ref 3–12)
NEUTROS PCT: 64 % (ref 43–77)
Neutro Abs: 6.5 10*3/uL (ref 1.7–7.7)
Platelets: 267 10*3/uL (ref 150–400)
RBC: 4.61 MIL/uL (ref 3.87–5.11)
RDW: 14.3 % (ref 11.5–15.5)
WBC: 10.3 10*3/uL (ref 4.0–10.5)

## 2014-02-04 LAB — WET PREP, GENITAL
CLUE CELLS WET PREP: NONE SEEN
Trich, Wet Prep: NONE SEEN
WBC, Wet Prep HPF POC: NONE SEEN
YEAST WET PREP: NONE SEEN

## 2014-02-04 LAB — COMPREHENSIVE METABOLIC PANEL
ALT: 16 U/L (ref 0–35)
AST: 20 U/L (ref 0–37)
Albumin: 3.5 g/dL (ref 3.5–5.2)
Alkaline Phosphatase: 80 U/L (ref 39–117)
BILIRUBIN TOTAL: 0.2 mg/dL — AB (ref 0.3–1.2)
BUN: 9 mg/dL (ref 6–23)
CHLORIDE: 100 meq/L (ref 96–112)
CO2: 23 mEq/L (ref 19–32)
Calcium: 9 mg/dL (ref 8.4–10.5)
Creatinine, Ser: 0.74 mg/dL (ref 0.50–1.10)
GFR calc non Af Amer: >90 mL/min (ref >90)
GLUCOSE: 80 mg/dL (ref 70–99)
POTASSIUM: 4.5 meq/L (ref 3.7–5.3)
Sodium: 136 mEq/L — ABNORMAL LOW (ref 137–147)
TOTAL PROTEIN: 7.6 g/dL (ref 6.0–8.3)

## 2014-02-04 LAB — POCT URINALYSIS DIP (DEVICE)
Bilirubin Urine: NEGATIVE
Glucose, UA: NEGATIVE mg/dL
KETONES UR: NEGATIVE mg/dL
Nitrite: NEGATIVE
PH: 6 (ref 5.0–8.0)
Protein, ur: NEGATIVE mg/dL
Urobilinogen, UA: 0.2 mg/dL (ref 0.0–1.0)

## 2014-02-04 LAB — LIPASE, BLOOD: LIPASE: 16 U/L (ref 11–59)

## 2014-02-04 LAB — POCT PREGNANCY, URINE: Preg Test, Ur: NEGATIVE

## 2014-02-04 MED ORDER — HYDROMORPHONE HCL PF 1 MG/ML IJ SOLN
1.0000 mg | Freq: Once | INTRAMUSCULAR | Status: AC
  Administered 2014-02-04: 1 mg via INTRAVENOUS
  Filled 2014-02-04: qty 1

## 2014-02-04 MED ORDER — FENTANYL CITRATE 0.05 MG/ML IJ SOLN
50.0000 ug | Freq: Once | INTRAMUSCULAR | Status: AC
Start: 2014-02-04 — End: 2014-02-04
  Administered 2014-02-04: 50 ug via INTRAVENOUS
  Filled 2014-02-04: qty 2

## 2014-02-04 MED ORDER — CEFTRIAXONE SODIUM 250 MG IJ SOLR
250.0000 mg | Freq: Once | INTRAMUSCULAR | Status: AC
  Administered 2014-02-04: 250 mg via INTRAMUSCULAR
  Filled 2014-02-04: qty 250

## 2014-02-04 MED ORDER — ONDANSETRON HCL 4 MG/2ML IJ SOLN
4.0000 mg | Freq: Once | INTRAMUSCULAR | Status: AC
  Administered 2014-02-04: 4 mg via INTRAVENOUS
  Filled 2014-02-04: qty 2

## 2014-02-04 MED ORDER — OXYCODONE-ACETAMINOPHEN 5-325 MG PO TABS: 1.0000 | ORAL_TABLET | Freq: Four times a day (QID) | ORAL | Status: DC | PRN

## 2014-02-04 MED ORDER — LIDOCAINE HCL (PF) 1 % IJ SOLN
INTRAMUSCULAR | Status: AC
  Administered 2014-02-04: 1 mL
  Filled 2014-02-04: qty 5

## 2014-02-04 MED ORDER — OXYCODONE-ACETAMINOPHEN 5-325 MG PO TABS
2.0000 | ORAL_TABLET | Freq: Once | ORAL | Status: AC
Start: 2014-02-04 — End: 2014-02-04
  Administered 2014-02-04: 2 via ORAL
  Filled 2014-02-04: qty 2

## 2014-02-04 MED ORDER — DOXYCYCLINE HYCLATE 100 MG PO TABS
100.0000 mg | ORAL_TABLET | Freq: Once | ORAL | Status: AC
  Administered 2014-02-04: 100 mg via ORAL
  Filled 2014-02-04: qty 1

## 2014-02-04 MED ORDER — DOXYCYCLINE HYCLATE 100 MG PO TABS: 100.0000 mg | ORAL_TABLET | Freq: Two times a day (BID) | ORAL | Status: DC

## 2014-02-04 MED ORDER — AZITHROMYCIN 250 MG PO TABS
1000.0000 mg | ORAL_TABLET | Freq: Once | ORAL | Status: AC
  Administered 2014-02-04: 1000 mg via ORAL
  Filled 2014-02-04: qty 4

## 2014-02-04 MED ORDER — IOHEXOL 300 MG/ML  SOLN
100.0000 mL | Freq: Once | INTRAMUSCULAR | Status: AC | PRN
  Administered 2014-02-04: 100 mL via INTRAVENOUS

## 2014-02-04 NOTE — Discharge Instructions (Signed)
Pelvic Inflammatory Disease  Pelvic inflammatory disease (PID) refers to an infection in some or all of the female organs. The infection can be in the uterus, ovaries, fallopian tubes, or the surrounding tissues in the pelvis. PID can cause abdominal or pelvic pain that comes on suddenly (acute pelvic pain). PID is a serious infection because it can lead to lasting (chronic) pelvic pain or the inability to have children (infertile).   CAUSES   The infection is often caused by the normal bacteria found in the vaginal tissues. PID may also be caused by an infection that is spread during sexual contact. PID can also occur following:   · The birth of a baby.    · A miscarriage.    · An abortion.    · Major pelvic surgery.    · The use of an intrauterine device (IUD).    · A sexual assault.    RISK FACTORS  Certain factors can put a person at higher risk for PID, such as:  · Being younger than 25 years.  · Being sexually active at a young age.  · Using nonbarrier contraception.  · Having multiple sexual partners.  · Having sex with someone who has symptoms of a genital infection.  · Using oral contraception.  Other times, certain behaviors can increase the possibility of getting PID, such as:  · Having sex during your period.  · Using a vaginal douche.  · Having an intrauterine device (IUD) in place.  SYMPTOMS   · Abdominal or pelvic pain.    · Fever.    · Chills.    · Abnormal vaginal discharge.  · Abnormal uterine bleeding.    · Unusual pain shortly after finishing your period.  DIAGNOSIS   Your caregiver will choose some of the following methods to make a diagnosis, such as:   · Performing a physical exam and history. A pelvic exam typically reveals a very tender uterus and surrounding pelvis.    · Ordering laboratory tests including a pregnancy test, blood tests, and urine test.   · Ordering cultures of the vagina and cervix to check for a sexually transmitted infection (STI).  · Performing an ultrasound.     · Performing a laparoscopic procedure to look inside the pelvis.    TREATMENT   · Antibiotic medicines may be prescribed and taken by mouth.    · Sexual partners may be treated when the infection is caused by a sexually transmitted disease (STD).    · Hospitalization may be needed to give antibiotics intravenously.  · Surgery may be needed, but this is rare.  It may take weeks until you are completely well. If you are diagnosed with PID, you should also be checked for human immunodeficiency virus (HIV).    HOME CARE INSTRUCTIONS   · If given, take your antibiotics as directed. Finish the medicine even if you start to feel better.    · Only take over-the-counter or prescription medicines for pain, discomfort, or fever as directed by your caregiver.    · Do not have sexual intercourse until treatment is completed or as directed by your caregiver. If PID is confirmed, your recent sexual partner(s) will need treatment.    · Keep your follow-up appointments.  SEEK MEDICAL CARE IF:   · You have increased or abnormal vaginal discharge.    · You need prescription medicine for your pain.    · You vomit.    · You cannot take your medicines.    · Your partner has an STD.    SEEK IMMEDIATE MEDICAL CARE IF:   · You have a fever.    · You have increased abdominal or   pelvic pain.    · You have chills.    · You have pain when you urinate.    · You are not better after 72 hours following treatment.    MAKE SURE YOU:   · Understand these instructions.  · Will watch your condition.  · Will get help right away if you are not doing well or get worse.  Document Released: 09/12/2005 Document Revised: 01/07/2013 Document Reviewed: 09/08/2011  ExitCare® Patient Information ©2014 [employer].

## 2014-02-04 NOTE — ED Provider Notes (Signed)
CSN: [id number]     Arrival date & time 02/04/14  1233 History   None    Chief Complaint  Patient presents with  . Abdominal Pain   (Consider location/radiation/quality/duration/timing/severity/associated sxs/prior Treatment) Patient is a 23 y.o. female presenting with abdominal pain. The history is provided by the patient.  Abdominal Pain Pain location:  RLQ Pain quality: sharp   Pain radiates to:  Does not radiate Pain severity:  Moderate Onset quality:  Gradual Duration:  2 weeks Timing:  Constant Progression:  Worsening Chronicity:  New Context comment:  Diarrhea x 1 day, 4d ago, persistent decreased appetitie since. Associated symptoms: anorexia   Associated symptoms: no constipation, no diarrhea, no dysuria, no fever, no hematemesis, no hematochezia, no hematuria, no nausea, no vaginal bleeding, no vaginal discharge and no vomiting     Past Medical History  Diagnosis Date  . Asthma   . Bipolar disorder   . Suicide attempt    Past Surgical History  Procedure Laterality Date  . No past surgeries     History reviewed. No pertinent family history. History  Substance Use Topics  . Smoking status: Current Every Day Smoker -- 0.50 packs/day  . Smokeless tobacco: Not on file  . Alcohol Use: No   OB History   Grav Para Term Preterm Abortions TAB SAB Ect Mult Living   2 1 1  1 1    1      Review of Systems  Constitutional: Negative.  Negative for fever.  Gastrointestinal: Positive for abdominal pain and anorexia. Negative for nausea, vomiting, diarrhea, constipation, hematochezia and hematemesis.  Genitourinary: Positive for menstrual problem. Negative for dysuria, hematuria, vaginal bleeding and vaginal discharge.  Musculoskeletal: Negative.     Allergies  Review of patient's allergies indicates no known allergies.  Home Medications   Prior to Admission medications   Medication Sig Start Date End Date Taking? Authorizing Provider  buPROPion (WELLBUTRIN XL) 150  MG 24 hr tablet Take 150 mg by mouth daily.    Historical Provider, MD  ibuprofen (ADVIL,MOTRIN) 600 MG tablet Take 1 tablet (600 mg total) by mouth every 6 (six) hours as needed for pain or fever. 06/23/13   Arley Pheniximothy M Galey, MD  ibuprofen (ADVIL,MOTRIN) 800 MG tablet Take 1 tablet (800 mg total) by mouth 3 (three) times daily. 08/31/13   Arie Sabinaatherine E Schinlever, PA-C  metroNIDAZOLE (FLAGYL) 500 MG tablet Take 1 tablet (500 mg total) by mouth 2 (two) times daily. 11/11/13   Reuben Likesavid C Keller, MD  Pseudoeph-Doxylamine-DM-APAP (NYQUIL PO) Take by mouth. Unsure of amount taken    Historical Provider, MD   BP 136/86  Pulse 82  Temp(Src) 98.6 F (37 C)  Resp 16  SpO2 97%  LMP 01/08/2014 Physical Exam  Nursing note and vitals reviewed. Constitutional: She is oriented to person, place, and time. She appears well-developed and well-nourished.  Neck: Normal range of motion. Neck supple.  Abdominal: Soft. She exhibits no distension and no mass. Bowel sounds are decreased. There is no hepatosplenomegaly. There is tenderness in the right upper quadrant. There is tenderness at McBurney's point. There is no rigidity, no rebound, no guarding, no CVA tenderness and negative Murphy's sign.    Neurological: She is alert and oriented to person, place, and time.  Skin: Skin is warm and dry.    ED Course  Procedures (including critical care time) Labs Review Labs Reviewed  POCT URINALYSIS DIP (DEVICE) - Abnormal; Notable for the following:    Hgb urine dipstick TRACE (*)  Leukocytes, UA TRACE (*)    All other components within normal limits  POCT PREGNANCY, URINE    Imaging Review No results found.   MDM   1. Abdominal pain, acute, right lower quadrant    Sent for ct eval of rlq pain for 2 wks , worse last eve.    Linna HoffJames D Kindl, MD 02/04/14 [phone]

## 2014-02-04 NOTE — ED Notes (Signed)
Pt sent from urgent care for evaluation of RLQ pain that has been ongoing for 2 weeks. Pt reports 1 episode of diarhea and occasional nausea. Pt states that she has tenderness to palpation.

## 2014-02-04 NOTE — ED Notes (Signed)
Pt  Reports  Low  abd  Pain  rlq      denys  Any  Bleeding  Or  Discharge      Symptoms  Off and  On  X  2  Week      Last  Period  Middle  Of  April          Slight  Diarrhea

## 2014-02-04 NOTE — ED Provider Notes (Signed)
CSN: [id number]     Arrival date & time 02/04/14  1452 History   First MD Initiated Contact with Patient 02/04/14 1629     Chief Complaint  Patient presents with  . Abdominal Pain     (Consider location/radiation/quality/duration/timing/severity/associated sxs/prior Treatment) Patient is a 23 y.o. female presenting with abdominal pain.  Abdominal Pain  Pt reports intermittent RLQ pain ongoing for the last 4 months but worse in the last 2 weeks. Comes and goes, No particular provoking or relieving factors. Not associated with N/V/D, dysuria, vaginal bleeding or discharge. [facility] CT [facility]. UA and hcg there neg.   Past Medical History  Diagnosis Date  . Asthma   . Bipolar disorder   . Suicide attempt    Past Surgical History  Procedure Laterality Date  . No past surgeries     History reviewed. No pertinent family history. History  Substance Use Topics  . Smoking status: Current Every Day Smoker -- 0.50 packs/day    Types: Cigarettes  . Smokeless tobacco: Not on file  . Alcohol Use: No   OB History   Grav Para Term Preterm Abortions TAB SAB Ect Mult Living   2 1 1  1 1    1      Review of Systems  Gastrointestinal: Positive for abdominal pain.   All other systems reviewed and are negative except as noted in HPI.      Allergies  Review of patient's allergies indicates no known allergies.  Home Medications   Prior to Admission medications   Not on File   BP 109/53  Pulse 70  Temp(Src) 98.6 F (37 C) (Oral)  Resp 16  Ht 5\' 6"  (1.676 m)  Wt 208 lb (94.348 kg)  BMI 33.59 kg/m2  SpO2 100%  LMP 01/08/2014 Physical Exam  Nursing note and vitals reviewed. Constitutional: She is oriented to person, place, and time. She appears well-developed and well-nourished.  HENT:  Head: Normocephalic and atraumatic.  Eyes: EOM are normal. Pupils are equal, round, and reactive to light.  Neck: Normal range of motion. Neck supple.  Cardiovascular:  Normal rate, normal heart sounds and intact distal pulses.   Pulmonary/Chest: Effort normal and breath sounds normal.  Abdominal: Bowel sounds are normal. She exhibits no distension. There is tenderness (inconsistent RLQ tenderness, mild, no peritoneal signs). There is no rebound and no guarding.  Musculoskeletal: Normal range of motion. She exhibits no edema and no tenderness.  Neurological: She is alert and oriented to person, place, and time. She has normal strength. No cranial nerve deficit or sensory deficit.  Skin: Skin is warm and dry. No rash noted.  Psychiatric: She has a normal mood and affect.    ED Course  Procedures (including critical care time) Labs Review Labs Reviewed  COMPREHENSIVE METABOLIC PANEL - Abnormal; Notable for the following:    Sodium 136 (*)    Total Bilirubin 0.2 (*)    All other components within normal limits  WET PREP, GENITAL  GC/CHLAMYDIA PROBE AMP  CBC WITH DIFFERENTIAL  LIPASE, BLOOD  HIV ANTIBODY (ROUTINE TESTING)    Imaging Review [place] Transvaginal Non-ob  02/04/2014   CLINICAL DATA:  Cyst.  Hydrosalpinx.  Pelvic pain.  EXAM: TRANSVAGINAL ULTRASOUND OF PELVIS  TECHNIQUE: Transvaginal ultrasound examination of the pelvis was performed including evaluation of the uterus, ovaries, adnexal regions, and pelvic cul-de-sac.  COMPARISON:  CT ABD/PELVIS W CM dated 02/04/2014; US OB TRANSVAGINAL MODIFY dated 09/15/2010  FINDINGS: Uterus  Measurements: 64 mm x 54 mm x 65 mm. Mass effect from left adnexal process displaces the uterus. Myometrial echotexture is within normal limits. The uterus is retroflexed. No fibroids or other mass visualized.  Endometrium  Thickness: 8 mm, within normal limits for a woman of child-bearing years. No focal abnormality visualized.  Right ovary  Measurements: 35 mm x 24 mm x 37 mm. The ovary itself appears within normal limits. There is a right-sided hydrosalpinx measuring 38 mm with 22 mm thickness fluid centrally.  Left ovary   Measurements: Left ovary is not identified as a discrete entity. There is a septated multi-cystic mass in the left adnexa, most compatible with tubo-ovarian abscess. There is peripheral flow around this complex mass. The mass measures 66 mm x 52 mm x 60 mm. The patient was focally tender throughout the examination but more so around the left adnexal mass. Left hydrosalpinx is also noted, measuring 25 mm x 19 mm.  Other findings:  Small amount of free fluid.  IMPRESSION: 1. Constellation of findings compatible with pelvic inflammatory disease with bilateral hydrosalpinx and complex hypervascular cystic lesion on the left consistent with with tubo-ovarian abscess. This correlates with the prior CT. 2. Small amount of reactive free fluid in the anatomic pelvis.   Electronically Signed   By: Andreas Newport M.D.   On: 02/04/2014 21:15   US Pelvis Complete  02/04/2014   CLINICAL DATA:  Cyst.  Hydrosalpinx.  Pelvic pain.  EXAM: TRANSVAGINAL ULTRASOUND OF PELVIS  TECHNIQUE: Transvaginal ultrasound examination of the pelvis was performed including evaluation of the uterus, ovaries, adnexal regions, and pelvic cul-de-sac.  COMPARISON:  CT ABD/PELVIS W CM dated 02/04/2014; US OB TRANSVAGINAL MODIFY dated 09/15/2010  FINDINGS: Uterus  Measurements: 64 mm x 54 mm x 65 mm. Mass effect from left adnexal process displaces the uterus. Myometrial echotexture is within normal limits. The uterus is retroflexed. No fibroids or other mass visualized.  Endometrium  Thickness: 8 mm, within normal limits for a woman of child-bearing years. No focal abnormality visualized.  Right ovary  Measurements: 35 mm x 24 mm x 37 mm. The ovary itself appears within normal limits. There is a right-sided hydrosalpinx measuring 38 mm with 22 mm thickness fluid centrally.  Left ovary  Measurements: Left ovary is not identified as a discrete entity. There is a septated multi-cystic mass in the left adnexa, most compatible with tubo-ovarian abscess. There  is peripheral flow around this complex mass. The mass measures 66 mm x 52 mm x 60 mm. The patient was focally tender throughout the examination but more so around the left adnexal mass. Left hydrosalpinx is also noted, measuring 25 mm x 19 mm.  Other findings:  Small amount of free fluid.  IMPRESSION: 1. Constellation of findings compatible with pelvic inflammatory disease with bilateral hydrosalpinx and complex hypervascular cystic lesion on the left consistent with with tubo-ovarian abscess. This correlates with the prior CT. 2. Small amount of reactive free fluid in the anatomic pelvis.   Electronically Signed   By: Andreas Newport M.D.   On: 02/04/2014 21:15   Ct Abdomen Pelvis W Contrast  02/04/2014   CLINICAL DATA:  Right lower quadrant pain  EXAM: CT ABDOMEN AND PELVIS WITH CONTRAST  TECHNIQUE: Multidetector CT imaging of the abdomen and pelvis was performed using the standard protocol following bolus administration of intravenous contrast.  CONTRAST:  OMNIPAQUE IOHEXOL 300 MG/ML  SOLN  COMPARISON:  Prior ultrasound from 09/15/2010  FINDINGS: Mild subsegmental atelectasis seen dependently  within the visualized lung bases.  The liver demonstrates a normal contrast enhanced appearance. Gallbladder is normal. No biliary ductal dilatation. The spleen, adrenal glands, and pancreas demonstrate a normal contrast enhanced appearance.  Kidneys are symmetric in size with the equal enhancement. No nephrolithiasis, hydronephrosis, or focal enhancing renal mass.  No evidence of bowel obstruction. Stomach is normal. The appendix is mildly prominent measuring 8 mm in diameter with scattered foci of gas present within the appendiceal lumen. No significant inflammatory changes seen about the appendix itself. No abnormal wall thickening, mucosal enhancement, or inflammatory fat stranding seen about the bowels.  Bladder is unremarkable.  Uterus is within normal limits.  A complex multiloculated cystic lesion measuring  approximately 3.0 x 5.4 x 6.2 cm seen within the of left adnexal a (series 201, image 75). Is unclear whether this represents a single lesion or adjacent left ovarian cyst. Additional cystic lesion measuring 3.2 x 3.7 x 3.5 cm also present within the left adnexa (series 201, image 70). A tubular hypodense structure within the right adnexa may represent a a hydrosalpinx. There is suggestion of a degenerating corpus luteal cyst within the right ovary.  Small volume free fluid present within the pelvis.  No free intraperitoneal air. Normal intravascular enhancement seen within the abdomen and pelvis. Few mildly prominent mesenteric lymph nodes measuring up to 9 mm is present within the right lower quadrant.  No acute osseous abnormality. No worrisome lytic or blastic osseous lesions.  IMPRESSION: 1. Mildly prominent appendix measuring 8 mm in diameter by without associated inflammatory changes to suggest acute appendicitis. 2. Complex multiloculated cystic lesion within the left adnexa. It is unclear whether this finding represents a single complex lesion versus several adjacent left ovarian cyst. Correlation with pelvic ultrasound would be helpful for further evaluation. 3. Question right hydrosalpinx. This could also be further evaluated with ultrasound.   Electronically Signed   By: Rise MuBenjamin  McClintock M.D.   On: 02/04/2014 18:53     EKG Interpretation None      MDM   Final diagnoses:  TOA (tubo-ovarian abscess)    Labs ordered in triage reviewed. CT and pain meds ordered  10:35 PM CT as above shows abnormalities in pelvis, sent for US which as above concerning for TOA. Pelvic exam has mild tenderness R adnexa, no L adnexal tenderness. No discharge or CMT. Spoke with Dr. Macon LargeAnyanwu on call for Gyn who has reviewed images. Pt without fever or leukocytosis and otherwise well appearing. She recommends a trial of outpatient treatment with Doxycycline after Rocephin/Zithromax in the ED and close followup  in Gyn office in 48hrs. Pt amenable to this plan.    Giang Hemme B. Bernette MayersSheldon, MD 02/04/14 2237

## 2014-02-05 LAB — GC/CHLAMYDIA PROBE AMP
CT Probe RNA: POSITIVE — AB
GC Probe RNA: NEGATIVE

## 2014-02-05 LAB — HIV ANTIBODY (ROUTINE TESTING W REFLEX): HIV 1&2 Ab, 4th Generation: NONREACTIVE

## 2014-02-06 ENCOUNTER — Telehealth (HOSPITAL_BASED_OUTPATIENT_CLINIC_OR_DEPARTMENT_OTHER): Payer: Self-pay | Admitting: Emergency Medicine

## 2014-02-06 NOTE — Telephone Encounter (Signed)
pt returned call. ID verified. Patient informed of + Chlamydia result and that treatment was given in ED with Rocephin and Zithromax. STD instructions provided, patient verbalized understanding.

## 2014-02-06 NOTE — Telephone Encounter (Signed)
+  Chlamydia. Patient treated with Rocephin and Zithromax. [facility] faxed. 

## 2014-02-07 ENCOUNTER — Encounter: Payer: Self-pay | Admitting: Obstetrics & Gynecology

## 2014-02-07 ENCOUNTER — Ambulatory Visit (INDEPENDENT_AMBULATORY_CARE_PROVIDER_SITE_OTHER): Payer: Medicaid Other | Admitting: Obstetrics & Gynecology

## 2014-02-07 VITALS — BP 127/88 | HR 73 | Temp 98.9°F | Ht 66.0 in | Wt 210.4 lb

## 2014-02-07 DIAGNOSIS — N7093 Salpingitis and oophoritis, unspecified: Secondary | ICD-10-CM

## 2014-02-07 DIAGNOSIS — Z3009 Encounter for other general counseling and advice on contraception: Secondary | ICD-10-CM

## 2014-02-07 MED ORDER — NORGESTIMATE-ETH ESTRADIOL 0.25-35 MG-MCG PO TABS: 1.0000 | ORAL_TABLET | Freq: Every day | ORAL | Status: DC

## 2014-02-07 MED ORDER — METRONIDAZOLE 500 MG PO TABS: 500.0000 mg | ORAL_TABLET | Freq: Two times a day (BID) | ORAL | Status: AC

## 2014-02-07 NOTE — Progress Notes (Signed)
Subjective:     Patient ID: Erin Hernandez [place]   DOB: Mar 16, 1991, 23 y.o.   MRN: [id number]  HPI 23 yo G2P1011 with dx of TOA.  Pt denies f/c.  She did have prev but, it has resolved.  Pt reports that the pain is still present but, improved.  Had nausea today only with Doxy but, did not have emesis   Review of Systems     Objective:   Physical Exam BP 127/88  Pulse 73  Temp(Src) 98.9 F (37.2 C) (Oral)  Ht 5\' 6"  (1.676 m)  Wt 210 lb 6.4 oz (95.437 kg)  BMI 33.98 kg/m2  LMP 01/08/2014 Pt in NAD Abd: soft, ND, sl tender in RLQ  02/04/2014 EXAM:  TRANSVAGINAL ULTRASOUND OF PELVIS  TECHNIQUE:  Transvaginal ultrasound examination of the pelvis was performed  including evaluation of the uterus, ovaries, adnexal regions, and  pelvic cul-de-sac.  COMPARISON: CT ABD/PELVIS W CM dated 02/04/2014; US OB TRANSVAGINAL  MODIFY dated 09/15/2010  FINDINGS:  Uterus  Measurements: 64 mm x 54 mm x 65 mm. Mass effect from left adnexal  process displaces the uterus. Myometrial echotexture is within  normal limits. The uterus is retroflexed. No fibroids or other mass  visualized.  Endometrium  Thickness: 8 mm, within normal limits for a woman of child-bearing  years. No focal abnormality visualized.  Right ovary  Measurements: 35 mm x 24 mm x 37 mm. The ovary itself appears  within normal limits. There is a right-sided hydrosalpinx measuring  38 mm with 22 mm thickness fluid centrally.  Left ovary  Measurements: Left ovary is not identified as a discrete entity.  There is a septated multi-cystic mass in the left adnexa, most  compatible with tubo-ovarian abscess. There is peripheral flow  around this complex mass. The mass measures 66 mm x 52 mm x 60 mm.  The patient was focally tender throughout the examination but more  so around the left adnexal mass. Left hydrosalpinx is also noted,  measuring 25 mm x 19 mm.  Other findings: Small amount of free fluid.  IMPRESSION:  1.  Constellation of findings compatible with pelvic inflammatory  disease with bilateral hydrosalpinx and complex hypervascular cystic  lesion on the left consistent with with tubo-ovarian abscess. This  correlates with the prior CT.  2. Small amount of reactive free fluid in the anatomic pelvis.       Assessment:     TOA    contraeption counseling- pt declines [facility] (wants to get pregnant) will use OCP's Plan:     F/u in 2 weeks or sooner prn Add Flagyl to Doxycycline Reviewed STI's and increased risk of ectopic pregnancy Sprintec 1 po q day

## 2014-02-07 NOTE — Patient Instructions (Signed)
Sexually Transmitted Disease A sexually transmitted disease (STD) is a disease or infection that may be passed (transmitted) from person to person, usually during sexual activity. This may happen by way of saliva, semen, blood, vaginal mucus, or urine. Common STDs include:   Gonorrhea.   Chlamydia.   Syphilis.   HIV and AIDS.   Genital herpes.   Hepatitis B and C.   Trichomonas.   Human papillomavirus (HPV).   Pubic lice.   Scabies.  Mites.  Bacterial vaginosis. WHAT ARE CAUSES OF STDs? An STD may be caused by bacteria, a virus, or parasites. STDs are often transmitted during sexual activity if one person is infected. However, they may also be transmitted through nonsexual means. STDs may be transmitted after:   Sexual intercourse with an infected person.   Sharing sex toys with an infected person.   Sharing needles with an infected person or using unclean piercing or tattoo needles.  Having intimate contact with the genitals, mouth, or rectal areas of an infected person.   Exposure to infected fluids during birth. WHAT ARE THE SIGNS AND SYMPTOMS OF STDs? Different STDs have different symptoms. Some people may not have any symptoms. If symptoms are present, they may include:   Painful or bloody urination.   Pain in the pelvis, abdomen, vagina, anus, throat, or eyes.   Skin rash, itching, irritation, growths, sores (lesions), ulcerations, or warts in the genital or anal area.  Abnormal vaginal discharge with or without bad odor.   Penile discharge in men.   Fever.   Pain or bleeding during sexual intercourse.   Swollen glands in the groin area.   Yellow skin and eyes (jaundice). This is seen with hepatitis.   Swollen testicles.  Infertility.  Sores and blisters in the mouth. HOW ARE STDs DIAGNOSED? To make a diagnosis, your health care provider may:   Take a medical history.   Perform a physical exam.   Take a sample of any  discharge for examination.  Swab the throat, cervix, opening to the penis, rectum, or vagina for testing.  Test a sample of your first morning urine.   Perform blood tests.   Perform a Pap smear, if this applies.   Perform a colposcopy.   Perform a laparoscopy.  HOW ARE STDs TREATED? Treatment depends on the STD. Some STDs may be treated but not cured.   Chlamydia, gonorrhea, trichomonas, and syphilis can be cured with antibiotics.   Genital herpes, hepatitis, and HIV can be treated, but not cured, with prescribed medicines. The medicines lessen symptoms.   Genital warts from HPV can be treated with medicine or by freezing, burning (electrocautery), or surgery. Warts may come back.   HPV cannot be cured with medicine or surgery. However, abnormal areas may be removed from the cervix, vagina, or vulva.   If your diagnosis is confirmed, your recent sexual partners need treatment. This is true even if they are symptom-free or have a negative culture or evaluation. They should not have sex until their health care providers say it is OK. HOW CAN I REDUCE MY RISK OF GETTING AN STD?  Use latex condoms, dental dams, and water-soluble lubricants during sexual activity. Do not use petroleum jelly or oils.  Get vaccinated for HPV and hepatitis. If you have not received these vaccines in the past, talk to your health care provider about whether one or both might be right for you.   Avoid risky sex practices that can break the skin.  WHAT SHOULD   I DO IF I THINK I HAVE AN STD?  See your health care provider.   Inform all sexual partners. They should be tested and treated for any STDs.  Do not have sex until your health care provider says it is OK. WHEN SHOULD I GET HELP? Seek immediate medical care if:  You develop severe abdominal pain.  You are a man and notice swelling or pain in the testicles.  You are a woman and notice swelling or pain in your vagina. Document  Released: 12/03/2002 Document Revised: 07/03/2013 Document Reviewed: 04/02/2013 [facility] Patient Information 2014 [place], [place].Chlamydia, Female Chlamydia is an infection. It is spread through sexual contact. Chlamydia can be in different areas of the body. These areas include the cervix, urethra, throat, or rectum. You may not know you have chlamydia because many people never develop the symptoms. Chlamydia is not difficult to treat once you know you have it. However, if it is left untreated, chlamydia can lead to more serious health problems.  CAUSES  Chlamydia is caused by bacteria. It is a sexually transmitted disease. It is passed from an infected partner during intimate contact. This contact could be with the genitals, mouth, or rectal area. Chlamydia can also be passed from mothers to babies during birth. SIGNS AND SYMPTOMS  There may not be any symptoms. This is often the case early in the infection. If symptoms develop, they may include:  Mild pain and discomfort when urinating.  Redness, soreness, and swelling (inflammation) of the rectum.  Vaginal discharge.  Painful intercourse.  Abdominal pain.  Bleeding between menstrual periods. DIAGNOSIS  To diagnose this infection, your health care provider will do a pelvic exam. Cultures will be taken of the vagina, cervix, urine, and possibly the rectum to verify the diagnosis.  TREATMENT You will be given antibiotic medicines. If you are pregnant, certain types of antibiotics will need to be avoided. Any sexual partners should also be treated, even if they do not show symptoms.  HOME CARE INSTRUCTIONS   Take your antibiotics as directed. Make sure you finish them even if you start to feel better.  Only take over-the-counter or prescription medicines for pain, discomfort, or fever as directed by your health care provider.  Inform any sexual partners about the infection. They should also be treated.  Do not have sexual contact  until your health care provider tells you it is okay.  Get plenty of rest.  Eat a well-balanced diet.  Drink enough fluids to keep your urine clear or pale yellow.  Follow up with your health care provider as directed. SEEK MEDICAL CARE IF:  You have painful urination.  You have abdominal pain.  You have vaginal discharge.  You have painful sexual intercourse.  You are a woman and have bleeding between periods and after sex. SEEK IMMEDIATE MEDICAL CARE IF:   You have a fever or persistent symptoms for more than 2 3 days.  You have a fever and your symptoms suddenly get worse.  You experience nausea or vomiting.  You experience excessive sweating (diaphoresis).  You have difficulty swallowing. MAKE SURE YOU:   Understand these instructions.  Will watch your condition.  Will get help right away if you are not doing well or get worse. Document Released: 06/22/2005 Document Revised: 07/03/2013 Document Reviewed: 05/20/2013 [facility] Patient Information 2014 [place], [place].

## 2014-02-07 NOTE — Progress Notes (Signed)
Pt. Here today for f/u of TOA; seen in ED 02/04/14. Pt. C/o of pain 10/10 states percocet doesn't help so she hasn't been taking them. C/o N/V from ABX.

## 2014-02-26 ENCOUNTER — Encounter: Payer: Medicaid Other | Admitting: Advanced Practice Midwife

## 2014-07-28 ENCOUNTER — Encounter: Payer: Self-pay | Admitting: Obstetrics & Gynecology

## 2015-01-07 ENCOUNTER — Encounter (HOSPITAL_COMMUNITY): Payer: Self-pay | Admitting: *Deleted

## 2015-01-07 ENCOUNTER — Emergency Department (INDEPENDENT_AMBULATORY_CARE_PROVIDER_SITE_OTHER)
Admission: EM | Admit: 2015-01-07 | Discharge: 2015-01-07 | Disposition: A | Discharge status: Home / Self Care | Payer: Medicaid Other | Source: Home / Self Care | Attending: Emergency Medicine | Admitting: Emergency Medicine

## 2015-01-07 DIAGNOSIS — R221 Localized swelling, mass and lump, neck: Secondary | ICD-10-CM | POA: Diagnosis not present

## 2015-01-07 MED ORDER — IBUPROFEN 800 MG PO TABS: 800.0000 mg | ORAL_TABLET | Freq: Once | ORAL | Status: DC

## 2015-01-07 MED ORDER — IBUPROFEN 800 MG PO TABS
ORAL_TABLET | ORAL | Status: AC
  Filled 2015-01-07: qty 1

## 2015-01-07 NOTE — Discharge Instructions (Signed)
It is possible that you have salivary stone that is stuck on left side, however, I would encourage you contact the Ear, Nose and Throat specialist on your discharge paperwork for further evaluation.

## 2015-01-07 NOTE — ED Notes (Signed)
Swelling  Of the  Glands  On the  l  Side  Of  Neck  For   sev  Months  Getting  Worse   - symptoms  Are  aggrevated  By  Eating     Pt  Airway is  Intact  Speaking in   Complete     sentances

## 2015-01-07 NOTE — ED Provider Notes (Signed)
CSN: [id number]     Arrival date & time 01/07/15  4098 History   First MD Initiated Contact with Patient 01/07/15 [phone]     Chief Complaint  Patient presents with  . Facial Swelling   (Consider location/radiation/quality/duration/timing/severity/associated sxs/prior Treatment) HPI Comments: Patient presents for evaluation of a painful mass that has been present at the left side of her neck for the past 2 months. She endorses that pain and swelling are both exacerbated by eating or drinking and that when she eats or drinks she also develops a painful swelling underneath the left side of her tongue. Denies previous episodes. Is a smoker. Denies fever/chills or unexplained weight loss. Denies additional areas of pain or swelling. Denies difficulty breathing, speaking or swallowing. Denies dental issues. Reports herself to be otherwise healthy.   The history is provided by the patient.    Past Medical History  Diagnosis Date  . Asthma   . Bipolar disorder   . Suicide attempt    Past Surgical History  Procedure Laterality Date  . No past surgeries     History reviewed. No pertinent family history. History  Substance Use Topics  . Smoking status: Current Every Day Smoker -- 0.50 packs/day    Types: Cigarettes  . Smokeless tobacco: Never Used  . Alcohol Use: No   OB History    Gravida Para Term Preterm AB TAB SAB Ectopic Multiple Living   Review of Systems  All other systems reviewed and are negative.   Allergies  Review of patient's allergies indicates no known allergies.  Home Medications   Prior to Admission medications   Medication Sig Start Date End Date Taking? Authorizing Provider  doxycycline (VIBRA-TABS) 100 MG tablet Take 1 tablet (100 mg total) by mouth 2 (two) times daily. 02/04/14   Susy Frizzle, MD  norgestimate-ethinyl estradiol (ORTHO-CYCLEN,SPRINTEC,PREVIFEM) 0.25-35 MG-MCG tablet Take 1 tablet by mouth daily. 02/07/14   Willodean Rosenthal, MD  oxyCODONE-acetaminophen (PERCOCET/ROXICET) 5-325 MG per tablet Take 1-2 tablets by mouth every 6 (six) hours as needed for severe pain. 02/04/14   Susy Frizzle, MD   BP 121/82 mmHg  Pulse 87  Temp(Src) 99.1 F (37.3 C) (Oral)  Resp 16  SpO2 98%  LMP 12/12/2014 Physical Exam  Constitutional: She is oriented to person, place, and time. She appears well-developed and well-nourished. No distress.  HENT:  Head: Normocephalic and atraumatic.  Right Ear: Hearing, tympanic membrane, external ear and ear canal normal.  Left Ear: Hearing, tympanic membrane, external ear and ear canal normal.  Nose: Nose normal.  Mouth/Throat: Uvula is midline, oropharynx is clear and moist and mucous membranes are normal. No oral lesions. No trismus in the jaw. No dental abscesses, uvula swelling or dental caries.  Eyes: Conjunctivae are normal. No scleral icterus.  Neck: Trachea normal, normal range of motion, full passive range of motion without pain and phonation normal. Neck supple. No thyromegaly present.    Tongue normal No submandibular lymphadenopathy  Cardiovascular: Normal rate, regular rhythm and normal heart sounds.   Pulmonary/Chest: Effort normal and breath sounds normal. No stridor. No respiratory distress. She has no wheezes.  Musculoskeletal: Normal range of motion.  Lymphadenopathy:    She has cervical adenopathy.  Neurological: She is alert and oriented to person, place, and time.  Skin: Skin is warm and dry. No rash noted. No erythema.  Psychiatric: She has a normal mood and affect. Her behavior is normal.  Nursing note and vitals reviewed.   ED Course  Procedures (including critical care time) Labs Review Labs Reviewed - No data to display  Imaging Review No results found.   MDM   1. Neck mass    [facility].    Ria ClockJennifer Lee H Renny Gunnarson, [place] 01/07/15 1105

## 2016-03-17 ENCOUNTER — Emergency Department (HOSPITAL_COMMUNITY): Payer: Medicaid Other

## 2016-03-17 ENCOUNTER — Emergency Department (HOSPITAL_COMMUNITY)
Admission: EM | Admit: 2016-03-17 | Discharge: 2016-03-17 | Disposition: A | Discharge status: Home / Self Care | Payer: Medicaid Other | Attending: Emergency Medicine | Admitting: Emergency Medicine

## 2016-03-17 ENCOUNTER — Encounter (HOSPITAL_COMMUNITY): Payer: Self-pay | Admitting: Emergency Medicine

## 2016-03-17 DIAGNOSIS — F17210 Nicotine dependence, cigarettes, uncomplicated: Secondary | ICD-10-CM | POA: Insufficient documentation

## 2016-03-17 DIAGNOSIS — Y92410 Unspecified street and highway as the place of occurrence of the external cause: Secondary | ICD-10-CM | POA: Insufficient documentation

## 2016-03-17 DIAGNOSIS — J45909 Unspecified asthma, uncomplicated: Secondary | ICD-10-CM | POA: Diagnosis not present

## 2016-03-17 DIAGNOSIS — S161XXA Strain of muscle, fascia and tendon at neck level, initial encounter: Secondary | ICD-10-CM | POA: Insufficient documentation

## 2016-03-17 DIAGNOSIS — Y939 Activity, unspecified: Secondary | ICD-10-CM | POA: Diagnosis not present

## 2016-03-17 DIAGNOSIS — Y999 Unspecified external cause status: Secondary | ICD-10-CM | POA: Insufficient documentation

## 2016-03-17 DIAGNOSIS — S199XXA Unspecified injury of neck, initial encounter: Secondary | ICD-10-CM | POA: Diagnosis present

## 2016-03-17 MED ORDER — METHOCARBAMOL 500 MG PO TABS
500.0000 mg | ORAL_TABLET | Freq: Once | ORAL | Status: AC
Start: 2016-03-17 — End: 2016-03-17
  Administered 2016-03-17: 500 mg via ORAL
  Filled 2016-03-17: qty 1

## 2016-03-17 MED ORDER — NAPROXEN 500 MG PO TABS: 500.0000 mg | ORAL_TABLET | Freq: Two times a day (BID) | ORAL | Status: DC | PRN

## 2016-03-17 MED ORDER — METHOCARBAMOL 500 MG PO TABS: 500.0000 mg | ORAL_TABLET | Freq: Two times a day (BID) | ORAL | Status: DC | PRN
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
## 2016-06-14 ENCOUNTER — Encounter (HOSPITAL_COMMUNITY): Payer: Self-pay | Admitting: *Deleted

## 2016-06-14 ENCOUNTER — Ambulatory Visit (HOSPITAL_COMMUNITY)
Admission: EM | Admit: 2016-06-14 | Discharge: 2016-06-14 | Disposition: A | Discharge status: Home / Self Care | Payer: Medicaid Other | Attending: Family Medicine | Admitting: Family Medicine

## 2016-06-14 DIAGNOSIS — R21 Rash and other nonspecific skin eruption: Secondary | ICD-10-CM

## 2016-06-14 DIAGNOSIS — T63441A Toxic effect of venom of bees, accidental (unintentional), initial encounter: Secondary | ICD-10-CM

## 2016-06-14 LAB — POCT PREGNANCY, URINE: Preg Test, Ur: NEGATIVE

## 2016-06-14 MED ORDER — FLUTICASONE PROPIONATE 0.05 % EX CREA: TOPICAL_CREAM | Freq: Two times a day (BID) | CUTANEOUS | 0 refills | Status: DC

## 2016-06-14 NOTE — ED Notes (Signed)
Plan of  Care  Discussed   With  Pt

## 2016-06-14 NOTE — ED Provider Notes (Signed)
[facility]    CSN: [id number] Arrival date & time: 06/14/16  1216  First Provider Contact:  First MD Initiated Contact with Patient 06/14/16 1417        History   Chief Complaint Chief Complaint  Patient presents with  . Insect Bite    HPI Erin Hernandez is a 25 y.o. female.    Rash  Location:  Leg Quality: itchiness, painful and swelling   Pain details:    Severity:  Mild   Onset quality:  Sudden   Progression:  Improving Context: insect bite/sting   Context comment:  Bee sting to right lower leg. Relieved by:  None tried Ineffective treatments:  None tried Associated symptoms: no joint pain     Past Medical History:  Diagnosis Date  . Asthma   . Bipolar disorder (HCC)   . Suicide attempt [facility])     Patient Active Problem List   Diagnosis Date Noted  . Bipolar disorder [facility])     Past Surgical History:  Procedure Laterality Date  . NO PAST SURGERIES      OB History    Gravida Para Term Preterm AB Living   2 1 1   1 1    SAB TAB Ectopic Multiple Live Births     1     1       Home Medications    Prior to Admission medications   Medication Sig Start Date End Date Taking? Authorizing Provider  doxycycline (VIBRA-TABS) 100 MG tablet Take 1 tablet (100 mg total) by mouth 2 (two) times daily. 02/04/14   Susy Frizzle, MD  methocarbamol (ROBAXIN) 500 MG tablet Take 1 tablet (500 mg total) by mouth 2 (two) times daily as needed for muscle spasms. 03/17/16   Chase Picket Ward, PA-C  naproxen (NAPROSYN) 500 MG tablet Take 1 tablet (500 mg total) by mouth 2 (two) times daily as needed. 03/17/16   Chase Picket Ward, PA-C  norgestimate-ethinyl estradiol (ORTHO-CYCLEN,SPRINTEC,PREVIFEM) 0.25-35 MG-MCG tablet Take 1 tablet by mouth daily. 02/07/14   Willodean Rosenthal, MD  oxyCODONE-acetaminophen (PERCOCET/ROXICET) 5-325 MG per tablet Take 1-2 tablets by mouth every 6 (six) hours as needed for severe pain. 02/04/14   Susy Frizzle, MD    Family  History History reviewed. No pertinent family history.  Social History Social History  Substance Use Topics  . Smoking status: Current Every Day Smoker    Packs/day: 0.50    Types: Cigarettes  . Smokeless tobacco: Never Used  . Alcohol use No     Allergies   Review of patient's allergies indicates no known allergies.   Review of Systems Review of Systems  Musculoskeletal: Negative for arthralgias and joint swelling.  Skin: Positive for rash.  All other systems reviewed and are negative.    Physical Exam Triage Vital Signs ED Triage Vitals [06/14/16 1352]  Enc Vitals Group     BP 119/70     Pulse Rate 67     Resp 16     Temp 98.3 F (36.8 C)     Temp Source Oral     SpO2 100 %     Weight      Height      Head Circumference      Peak Flow      Pain Score      Pain Loc      Pain Edu?      Excl. in GC?    No data found.   Updated Vital Signs BP 119/70 (BP  Location: Right Arm)   Pulse 67   Temp 98.3 F (36.8 C) (Oral)   Resp 16   SpO2 100%   Visual Acuity Right Eye Distance:   Left Eye Distance:   Bilateral Distance:    Right Eye Near:   Left Eye Near:    Bilateral Near:     Physical Exam  Constitutional: She appears well-developed and well-nourished.  Skin: Skin is warm and dry. No erythema.  Local leg soreness to ifrapatellar leg, no rash or sign of infection.  Nursing note and vitals reviewed.    UC Treatments / Results  Labs (all labs ordered are listed, but only abnormal results are displayed) Labs Reviewed - No data to display  EKG  EKG Interpretation None       Radiology No results found.  Procedures Procedures (including critical care time)  Medications Ordered in UC Medications - No data to display   Initial Impression / Assessment and Plan / UC Course  I have reviewed the triage vital signs and the nursing notes.  Pertinent labs & imaging results that were available during my care of the patient were reviewed by  me and considered in my medical decision making (see chart for details).  Clinical Course      Final Clinical Impressions(s) / UC Diagnoses   Final diagnoses:  None    New Prescriptions New Prescriptions   No medications on file     Linna HoffJames D Raushanah Osmundson, MD 06/14/16 1500

## 2016-06-14 NOTE — ED Triage Notes (Signed)
Pt  Has  Two  Issues   She  Got stung    r  Knee  By a  Bee   And  She  Also  Wants    A  Pregnancy   Test     She  Displays  No  Angioedema  And  Appears  In no  Severe  /  Acute  Distress

## 2016-10-20 ENCOUNTER — Encounter (HOSPITAL_COMMUNITY): Payer: Self-pay | Admitting: Family Medicine

## 2016-11-07 ENCOUNTER — Ambulatory Visit: Payer: Medicaid Other | Admitting: Family Medicine

## 2016-11-18 ENCOUNTER — Ambulatory Visit: Payer: Medicaid Other | Admitting: Student

## 2016-12-02 ENCOUNTER — Ambulatory Visit: Payer: Medicaid Other | Admitting: Family Medicine

## 2017-06-26 ENCOUNTER — Encounter (HOSPITAL_COMMUNITY): Payer: Self-pay

## 2017-06-26 ENCOUNTER — Emergency Department (HOSPITAL_COMMUNITY): Payer: Medicaid Other

## 2017-06-26 ENCOUNTER — Emergency Department (HOSPITAL_COMMUNITY)
Admission: EM | Admit: 2017-06-26 | Discharge: 2017-06-27 | Disposition: A | Discharge status: Home / Self Care | Payer: Medicaid Other | Attending: Emergency Medicine | Admitting: Emergency Medicine

## 2017-06-26 DIAGNOSIS — F17210 Nicotine dependence, cigarettes, uncomplicated: Secondary | ICD-10-CM | POA: Insufficient documentation

## 2017-06-26 DIAGNOSIS — R079 Chest pain, unspecified: Secondary | ICD-10-CM | POA: Insufficient documentation

## 2017-06-26 DIAGNOSIS — J029 Acute pharyngitis, unspecified: Secondary | ICD-10-CM | POA: Diagnosis present

## 2017-06-26 DIAGNOSIS — J45909 Unspecified asthma, uncomplicated: Secondary | ICD-10-CM | POA: Insufficient documentation

## 2017-06-26 DIAGNOSIS — Z79899 Other long term (current) drug therapy: Secondary | ICD-10-CM | POA: Insufficient documentation

## 2017-06-26 DIAGNOSIS — K122 Cellulitis and abscess of mouth: Secondary | ICD-10-CM | POA: Diagnosis not present

## 2017-06-26 LAB — CBC
HCT: 39.4 % (ref 36.0–46.0)
Hemoglobin: 13 g/dL (ref 12.0–15.0)
MCH: 28.5 pg (ref 26.0–34.0)
MCHC: 33 g/dL (ref 30.0–36.0)
MCV: 86.4 fL (ref 78.0–100.0)
Platelets: 272 10*3/uL (ref 150–400)
RBC: 4.56 MIL/uL (ref 3.87–5.11)
RDW: 15.3 % (ref 11.5–15.5)
WBC: 13.8 10*3/uL — AB (ref 4.0–10.5)

## 2017-06-26 LAB — BASIC METABOLIC PANEL
Anion gap: 7 (ref 5–15)
BUN: 5 mg/dL — ABNORMAL LOW (ref 6–20)
CALCIUM: 8.8 mg/dL — AB (ref 8.9–10.3)
CO2: 25 mmol/L (ref 22–32)
CREATININE: 0.75 mg/dL (ref 0.44–1.00)
Chloride: 104 mmol/L (ref 101–111)
Glucose, Bld: 84 mg/dL (ref 65–99)
Potassium: 4.4 mmol/L (ref 3.5–5.1)
SODIUM: 136 mmol/L (ref 135–145)

## 2017-06-26 LAB — I-STAT TROPONIN, ED: TROPONIN I, POC: 0 ng/mL (ref 0.00–0.08)

## 2017-06-26 MED ORDER — DIPHENHYDRAMINE HCL 25 MG PO CAPS
25.0000 mg | ORAL_CAPSULE | Freq: Once | ORAL | Status: AC
  Administered 2017-06-26: 25 mg via ORAL
  Filled 2017-06-26: qty 1

## 2017-06-26 MED ORDER — DEXAMETHASONE SODIUM PHOSPHATE 10 MG/ML IJ SOLN
10.0000 mg | Freq: Once | INTRAMUSCULAR | Status: AC
  Administered 2017-06-26: 10 mg via INTRAMUSCULAR
  Filled 2017-06-26: qty 1

## 2017-06-26 MED ORDER — KETOROLAC TROMETHAMINE 30 MG/ML IJ SOLN
30.0000 mg | Freq: Once | INTRAMUSCULAR | Status: AC
  Administered 2017-06-26: 30 mg via INTRAMUSCULAR
  Filled 2017-06-26: qty 1
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
## 2017-06-27 MED ORDER — OMEPRAZOLE 20 MG PO CPDR: 20.0000 mg | DELAYED_RELEASE_CAPSULE | Freq: Every day | ORAL | 0 refills | Status: DC

## 2017-06-27 MED ORDER — IBUPROFEN 600 MG PO TABS: 600.0000 mg | ORAL_TABLET | Freq: Four times a day (QID) | ORAL | 0 refills | Status: DC | PRN

## 2017-06-27 MED ORDER — DIPHENHYDRAMINE HCL 25 MG PO CAPS: 25.0000 mg | ORAL_CAPSULE | Freq: Four times a day (QID) | ORAL | 0 refills | Status: DC | PRN

## 2017-06-27 NOTE — ED Provider Notes (Signed)
[facility]-EMERGENCY DEPT Provider Note   CSN: [id number] Arrival date & time: 06/26/17  1934     History   Chief Complaint Chief Complaint  Patient presents with  . Sore Throat  . Chest Pain    HPI Erin Hernandez is a 26 y.o. female.  HPI  This a 26 year old female with a history of asthma and bipolar disorder who presents with sore throat and chest pain. Patient reports 1-2 day history of increasing sore throat. She states "I feel like something is stuck." She is able to tolerate solids and liquids. She has pain with swallowing. She states it feels more full when she lays down and like she is not able to keep her airway open. She denies any exposures. She states that when she swallows she has chest pain that radiates back up into her neck. Current pain is 8 out of 10. She denies any fevers or respiratory symptoms to include congestion, rhinorrhea,ear pain. She has not tried anything for pain.  Past Medical History:  Diagnosis Date  . Asthma   . Bipolar disorder (HCC)   . Suicide attempt [facility])     Patient Active Problem List   Diagnosis Date Noted  . Bipolar disorder [facility])     Past Surgical History:  Procedure Laterality Date  . NO PAST SURGERIES      OB History    Gravida Para Term Preterm AB Living   SAB TAB Ectopic Multiple Live Births     1     1       Home Medications    Prior to Admission medications   Medication Sig Start Date End Date Taking? Authorizing Provider  diphenhydrAMINE (BENADRYL) 25 mg capsule Take 1 capsule (25 mg total) by mouth every 6 (six) hours as needed. 06/27/17   Narcissa Melder, Mayer Masker, MD  doxycycline (VIBRA-TABS) 100 MG tablet Take 1 tablet (100 mg total) by mouth 2 (two) times daily. 02/04/14   Susy Frizzle, MD  fluticasone (CUTIVATE) 0.05 % cream Apply topically 2 (two) times daily. 06/14/16   Linna Hoff, MD  ibuprofen (ADVIL,MOTRIN) 600 MG tablet Take 1 tablet (600 mg total) by mouth every 6 (six) hours as needed.  06/27/17   Kayleanna Lorman, Mayer Masker, MD  methocarbamol (ROBAXIN) 500 MG tablet Take 1 tablet (500 mg total) by mouth 2 (two) times daily as needed for muscle spasms. 03/17/16   Ward, Chase Picket, PA-C  naproxen (NAPROSYN) 500 MG tablet Take 1 tablet (500 mg total) by mouth 2 (two) times daily as needed. 03/17/16   Ward, Chase Picket, PA-C  norgestimate-ethinyl estradiol (ORTHO-CYCLEN,SPRINTEC,PREVIFEM) 0.25-35 MG-MCG tablet Take 1 tablet by mouth daily. 02/07/14   Willodean Rosenthal, MD  omeprazole (PRILOSEC) 20 MG capsule Take 1 capsule (20 mg total) by mouth daily. 06/27/17   Fantashia Shupert, Mayer Masker, MD  oxyCODONE-acetaminophen (PERCOCET/ROXICET) 5-325 MG per tablet Take 1-2 tablets by mouth every 6 (six) hours as needed for severe pain. 02/04/14   Susy Frizzle, MD    Family History Family History  Problem Relation Age of Onset  . Hypertension Mother   . Hypertension Father   . Hypertension Maternal Grandmother   . Hypertension Maternal Grandfather   . Hypertension Maternal Aunt   . Alcohol abuse Maternal Aunt     Social History Social History  Substance Use Topics  . Smoking status: Current Every Day Smoker    Packs/day: 0.50    Types: Cigarettes  . Smokeless tobacco: Never  Used  . Alcohol use No     Allergies   Patient has no known allergies.   Review of Systems Review of Systems  Constitutional: Negative for fever.  HENT: Positive for sore throat. Negative for congestion, rhinorrhea and trouble swallowing.   Respiratory: Negative for shortness of breath.   Cardiovascular: Positive for chest pain.  Gastrointestinal: Negative for abdominal pain, diarrhea and nausea.  Genitourinary: Negative for dysuria.  All other systems reviewed and are negative.    Physical Exam Updated Vital Signs BP 119/61   Pulse 67   Temp 98.7 F (37.1 C) (Oral)   Resp 16   LMP 06/19/2017   SpO2 100%   Physical Exam  Constitutional: She is oriented to person, place, and time. She appears  well-developed and well-nourished. No distress.  HENT:  Head: Normocephalic and atraumatic.  No significant posterior oropharynx erythema, uvula is mildly edematous and erythematous, no trismus  Eyes: Pupils are equal, round, and reactive to light.  Neck: Neck supple.  Cardiovascular: Normal rate, regular rhythm and normal heart sounds.   Pulmonary/Chest: Effort normal. No stridor. No respiratory distress. She has no wheezes. She exhibits no tenderness.  Abdominal: Soft. There is no tenderness.  Lymphadenopathy:    She has no cervical adenopathy.  Neurological: She is alert and oriented to person, place, and time.  Skin: Skin is warm and dry.  Psychiatric: She has a normal mood and affect.  Nursing note and vitals reviewed.    ED Treatments / Results  Labs (all labs ordered are listed, but only abnormal results are displayed) Labs Reviewed  BASIC METABOLIC PANEL - Abnormal; Notable for the following:       Result Value   BUN 5 (*)    Calcium 8.8 (*)    All other components within normal limits  CBC - Abnormal; Notable for the following:    WBC 13.8 (*)    All other components within normal limits  I-STAT TROPONIN, ED    EKG  EKG Interpretation  Date/Time:  Monday June 26 2017 19:45:44 EDT Ventricular Rate:  74 PR Interval:  120 QRS Duration: 82 QT Interval:  326 QTC Calculation: 361 R Axis:   53 Text Interpretation:  Normal sinus rhythm Normal ECG Confirmed by Ross Marcus ([id number]) on 06/26/2017 11:02:46 PM       Radiology Dg Chest 2 View  Result Date: 06/26/2017 CLINICAL DATA:  Chest pain EXAM: CHEST  2 VIEW COMPARISON:  06/23/2013 FINDINGS: The heart size and mediastinal contours are within normal limits. Both lungs are clear. The visualized skeletal structures are unremarkable. IMPRESSION: No active cardiopulmonary disease. Electronically Signed   By: Jasmine Pang M.D.   On: 06/26/2017 20:47    Procedures Procedures (including critical care  time)  Medications Ordered in ED Medications  dexamethasone (DECADRON) injection 10 mg (10 mg Intramuscular Given 06/26/17 2341)  ketorolac (TORADOL) 30 MG/ML injection 30 mg (30 mg Intramuscular Given 06/26/17 2341)  diphenhydrAMINE (BENADRYL) capsule 25 mg (25 mg Oral Given 06/26/17 2340)     Initial Impression / Assessment and Plan / ED Course  I have reviewed the triage vital signs and the nursing notes.  Pertinent labs & imaging results that were available during my care of the patient were reviewed by me and considered in my medical decision making (see chart for details).     Patient presents with sore throat. She is tolerating solids and liquids. No acute distress. ABCs intact. Satting 100% on oxygen. No infection symptoms. On clinical  exam,she has evidence of uvulitis. Suspect allergic versus viral.She did report some chest pain but it seems to radiate back and her throat. Her EKG and troponin are reassuring. Chest x-ray is negative. Patient was given IM Decadron and Toradol. She was additionally given Benadryl. Recommend further supportive measures. At this time doubt strep pharyngitis or deep space infection. She was given strict return precautions.  After history, exam, and medical workup I feel the patient has been appropriately medically screened and is safe for discharge home. Pertinent diagnoses were discussed with the patient. Patient was given return precautions.   Final Clinical Impressions(s) / ED Diagnoses   Final diagnoses:  Uvulitis    New Prescriptions New Prescriptions   DIPHENHYDRAMINE (BENADRYL) 25 MG CAPSULE    Take 1 capsule (25 mg total) by mouth every 6 (six) hours as needed.   IBUPROFEN (ADVIL,MOTRIN) 600 MG TABLET    Take 1 tablet (600 mg total) by mouth every 6 (six) hours as needed.   OMEPRAZOLE (PRILOSEC) 20 MG CAPSULE    Take 1 capsule (20 mg total) by mouth daily.     Shon Baton, MD 06/27/17 [id number]

## 2017-06-27 NOTE — ED Notes (Signed)
Pt stable, ambulatory, states understanding of discharge instructions 

## 2017-08-01 ENCOUNTER — Encounter (HOSPITAL_COMMUNITY): Payer: Self-pay | Admitting: Radiology

## 2017-08-01 ENCOUNTER — Emergency Department (HOSPITAL_COMMUNITY): Payer: Medicaid Other

## 2017-08-01 ENCOUNTER — Emergency Department (HOSPITAL_COMMUNITY)
Admission: EM | Admit: 2017-08-01 | Discharge: 2017-08-02 | Disposition: A | Discharge status: Home / Self Care | Payer: Medicaid Other | Attending: Physician Assistant | Admitting: Physician Assistant

## 2017-08-01 DIAGNOSIS — Z79899 Other long term (current) drug therapy: Secondary | ICD-10-CM | POA: Insufficient documentation

## 2017-08-01 DIAGNOSIS — R221 Localized swelling, mass and lump, neck: Secondary | ICD-10-CM | POA: Diagnosis present

## 2017-08-01 DIAGNOSIS — F17210 Nicotine dependence, cigarettes, uncomplicated: Secondary | ICD-10-CM | POA: Diagnosis not present

## 2017-08-01 DIAGNOSIS — J45909 Unspecified asthma, uncomplicated: Secondary | ICD-10-CM | POA: Insufficient documentation

## 2017-08-01 DIAGNOSIS — K115 Sialolithiasis: Secondary | ICD-10-CM

## 2017-08-01 LAB — I-STAT CHEM 8, ED
BUN: 11 mg/dL (ref 6–20)
CREATININE: 0.8 mg/dL (ref 0.44–1.00)
Calcium, Ion: 1.1 mmol/L — ABNORMAL LOW (ref 1.15–1.40)
Chloride: 101 mmol/L (ref 101–111)
GLUCOSE: 89 mg/dL (ref 65–99)
HCT: 42 % (ref 36.0–46.0)
HEMOGLOBIN: 14.3 g/dL (ref 12.0–15.0)
POTASSIUM: 4.2 mmol/L (ref 3.5–5.1)
Sodium: 137 mmol/L (ref 135–145)
TCO2: 25 mmol/L (ref 22–32)

## 2017-08-01 LAB — I-STAT BETA HCG BLOOD, ED (MC, WL, AP ONLY): I-stat hCG, quantitative: <5 m[IU]/mL (ref <5)

## 2017-08-01 MED ORDER — KETOROLAC TROMETHAMINE 30 MG/ML IJ SOLN
30.0000 mg | Freq: Once | INTRAMUSCULAR | Status: AC
  Administered 2017-08-01: 30 mg via INTRAVENOUS
  Filled 2017-08-01: qty 1

## 2017-08-01 MED ORDER — IOPAMIDOL (ISOVUE-300) INJECTION 61%
INTRAVENOUS | Status: AC
  Filled 2017-08-01: qty 75

## 2017-08-01 MED ORDER — IOPAMIDOL (ISOVUE-300) INJECTION 61%
75.0000 mL | Freq: Once | INTRAVENOUS | Status: AC | PRN
  Administered 2017-08-01: 75 mL via INTRAVENOUS

## 2017-08-01 NOTE — ED Provider Notes (Signed)
[facility]-EMERGENCY DEPT Provider Note   CSN: [id number] Arrival date & time: 08/01/17  1642     History   Chief Complaint Chief Complaint  Patient presents with  . Lymphadenopathy    HPI Erin Hernandez is a 26 y.o. female.  HPI  26 y.o. female with a hx of Asthma, Bipolar Disorder, presents to the Emergency Department today due to left sided neck/tongue swelling x 2 weeks. Notes this occurred after eating spicy food. States pain is 8/10. Worse with eating spicy foods. Notes pain is an isolated knot to left submandibular region. Notes that the knot grows sometimes when she eats spicy foods. Denies N/V/D. No CP/SOB/ABD pain. No fevers. OTC medications with minimal relief. Denies other medical history other than asthma. No other symptoms noted.   Past Medical History:  Diagnosis Date  . Asthma   . Bipolar disorder (HCC)   . Suicide attempt [facility])     Patient Active Problem List   Diagnosis Date Noted  . Bipolar disorder [facility])     Past Surgical History:  Procedure Laterality Date  . NO PAST SURGERIES      OB History    Gravida Para Term Preterm AB Living   2 1 1   1 1    SAB TAB Ectopic Multiple Live Births     1     1       Home Medications    Prior to Admission medications   Medication Sig Start Date End Date Taking? Authorizing Provider  diphenhydrAMINE (BENADRYL) 25 mg capsule Take 1 capsule (25 mg total) by mouth every 6 (six) hours as needed. 06/27/17   Horton, Mayer Masker, MD  doxycycline (VIBRA-TABS) 100 MG tablet Take 1 tablet (100 mg total) by mouth 2 (two) times daily. 02/04/14   Susy Frizzle, MD  fluticasone (CUTIVATE) 0.05 % cream Apply topically 2 (two) times daily. 06/14/16   Linna Hoff, MD  ibuprofen (ADVIL,MOTRIN) 600 MG tablet Take 1 tablet (600 mg total) by mouth every 6 (six) hours as needed. 06/27/17   Horton, Mayer Masker, MD  methocarbamol (ROBAXIN) 500 MG tablet Take 1 tablet (500 mg total) by mouth 2 (two) times daily  as needed for muscle spasms. 03/17/16   Ward, Chase Picket, PA-C  naproxen (NAPROSYN) 500 MG tablet Take 1 tablet (500 mg total) by mouth 2 (two) times daily as needed. 03/17/16   Ward, Chase Picket, PA-C  norgestimate-ethinyl estradiol (ORTHO-CYCLEN,SPRINTEC,PREVIFEM) 0.25-35 MG-MCG tablet Take 1 tablet by mouth daily. 02/07/14   Willodean Rosenthal, MD  omeprazole (PRILOSEC) 20 MG capsule Take 1 capsule (20 mg total) by mouth daily. 06/27/17   Horton, Mayer Masker, MD  oxyCODONE-acetaminophen (PERCOCET/ROXICET) 5-325 MG per tablet Take 1-2 tablets by mouth every 6 (six) hours as needed for severe pain. 02/04/14   Susy Frizzle, MD    Family History Family History  Problem Relation Age of Onset  . Hypertension Mother   . Hypertension Father   . Hypertension Maternal Grandmother   . Hypertension Maternal Grandfather   . Hypertension Maternal Aunt   . Alcohol abuse Maternal Aunt     Social History Social History   Tobacco Use  . Smoking status: Current Every Day Smoker    Packs/day: 0.50    Types: Cigarettes  . Smokeless tobacco: Never Used  Substance Use Topics  . Alcohol use: No  . Drug use: No     Allergies   Patient has no known allergies.   Review of Systems Review of  Systems ROS reviewed and all are negative for acute change except as noted in the HPI.  Physical Exam Updated Vital Signs BP 132/80 (BP Location: Left Arm)   Pulse 73   Temp 98.2 F (36.8 C) (Oral)   Resp 16   SpO2 98%   Physical Exam  Constitutional: She is oriented to person, place, and time. She appears well-developed and well-nourished. No distress.  HENT:  Head: Normocephalic and atraumatic.  Right Ear: Tympanic membrane, external ear and ear canal normal.  Left Ear: Tympanic membrane, external ear and ear canal normal.  Nose: Nose normal.  Mouth/Throat: Uvula is midline, oropharynx is clear and moist and mucous membranes are normal. No trismus in the jaw. No oropharyngeal exudate,  posterior oropharyngeal erythema or tonsillar abscesses.  Eyes: EOM are normal. Pupils are equal, round, and reactive to light.  Neck: Normal range of motion. Neck supple. No tracheal deviation present.  Palpable painful nodule left submandibular space. No erythema. Non fluctuant. Area it hard. Non mobile  Cardiovascular: Normal rate, regular rhythm, S1 normal, S2 normal, normal heart sounds, intact distal pulses and normal pulses.  Pulmonary/Chest: Effort normal and breath sounds normal. No respiratory distress. She has no decreased breath sounds. She has no wheezes. She has no rhonchi. She has no rales.  Abdominal: Normal appearance and bowel sounds are normal. There is no tenderness.  Musculoskeletal: Normal range of motion.  Neurological: She is alert and oriented to person, place, and time.  Skin: Skin is warm and dry.  Psychiatric: She has a normal mood and affect. Her speech is normal and behavior is normal. Thought content normal.  Nursing note and vitals reviewed.  ED Treatments / Results  Labs (all labs ordered are listed, but only abnormal results are displayed) Labs Reviewed  I-STAT CHEM 8, ED - Abnormal; Notable for the following components:      Result Value   Calcium, Ion 1.10 (*)    All other components within normal limits  I-STAT BETA HCG BLOOD, ED ([facility], [facility], [facility] ONLY)    EKG  EKG Interpretation None       Radiology No results found.  Procedures Procedures (including critical care time)  Medications Ordered in ED Medications  ketorolac (TORADOL) 30 MG/ML injection 30 mg (not administered)     Initial Impression / Assessment and Plan / ED Course  I have reviewed the triage vital signs and the nursing notes.  Pertinent labs & imaging results that were available during my care of the patient were reviewed by me and considered in my medical decision making (see chart for details).  Final Clinical Impressions(s) / ED Diagnoses     {I have reviewed the  relevant previous healthcare records.  {I obtained HPI from historian.   ED Course:  Assessment: Pt is a 26 y.o. female with a hx of Asthma, Bipolar Disorder, presents to the Emergency Department today due to left sided neck/tongue swelling x 2 weeks. Notes this occurred after eating spicy food. States pain is 8/10. Worse with eating spicy foods. Notes pain is an isolated knot to left submandibular region. Notes that the knot grows sometimes when she eats spicy foods. Denies N/V/D. No CP/SOB/ABD pain. No fevers. OTC medications with minimal relief. Denies other medical history other than asthma. On exam, pt in NAD. Nontoxic/nonseptic appearing. VSS. Afebrile. Lungs CTA. Heart RRR. Palpable painful nodule left submandibular space. No erythema. Non fluctuant. Area it hard. Non mobile. CT Soft Tissue pending. Suspect salivary stone.   Disposition/Plan:  10:48 PM- Sign out to Elpidio AnisShari Upstill, PA-C Pending CT Soft Tissue Neck Pt acknowledges and agrees with plan  Supervising Physician Abelino DerrickMackuen, Courteney Lyn, *  Final diagnoses:  Neck mass    ED Discharge Orders    None       Audry PiliMohr, Amoree Newlon, PA-C 08/01/17 2249    Abelino DerrickMackuen, Courteney Lyn, MD 08/01/17 2300

## 2017-08-01 NOTE — Discharge Instructions (Addendum)
Please read and follow all provided instructions.  Your diagnoses today include:  1. Neck mass     Tests performed today include: Vital signs. See below for your results today.   Medications prescribed:  Take as prescribed   Home care instructions:  Follow any educational materials contained in this packet.  Follow-up instructions: Please follow-up with your primary care provider for further evaluation of symptoms and treatment   Return instructions:  Please return to the Emergency Department if you do not get better, if you get worse, or new symptoms OR  - Fever (temperature greater than 101.23F)  - Bleeding that does not stop with holding pressure to the area    -Severe pain (please note that you may be more sore the day after your accident)  - Chest Pain  - Difficulty breathing  - Severe nausea or vomiting  - Inability to tolerate food and liquids  - Passing out  - Skin becoming red around your wounds  - Change in mental status (confusion or lethargy)  - New numbness or weakness    Please return if you have any other emergent concerns.  Additional Information:  Your vital signs today were: BP 129/75 (BP Location: Left Arm)    Pulse 76    Temp 98.2 F (36.8 C) (Oral)    Resp (!) 21    SpO2 100%  If your blood pressure (BP) was elevated above 135/85 this visit, please have this repeated by your doctor within one month. ---------------

## 2017-08-01 NOTE — ED Notes (Signed)
Pt complaining about wait time. The RN explained that we are working hard to get her back in a timely fashion, but we take patients back based on acuity.

## 2017-08-01 NOTE — ED Provider Notes (Signed)
Submental mass x 2-3 days No fever No dental pain No change in eating, but makes sxs worse  Pending CT to r/i salivary stone  CT scan shows submandibular salivary gland stone with sialadenitis.   On re-evaluation, she requests additional pain medication. Will transition to PO medications and discharge home. Referral to ENT provided if pain is no better in 3 days.   Elpidio AnisUpstill, Antwan Bribiesca, PA-C 08/02/17 [phone]    Abelino DerrickMackuen, Courteney Lyn, MD 08/03/17 [phone]

## 2017-08-01 NOTE — ED Triage Notes (Signed)
Left sided neck/tongue swelling x 2 weeks after eating spicy food per patient. Difficulty eating/swallowing.

## 2017-08-02 MED ORDER — HYDROCODONE-ACETAMINOPHEN 5-325 MG PO TABS: 1.0000 | ORAL_TABLET | ORAL | 0 refills | Status: DC | PRN

## 2017-08-02 MED ORDER — HYDROCODONE-ACETAMINOPHEN 5-325 MG PO TABS: 1.0000 | ORAL_TABLET | Freq: Once | ORAL | Status: DC

## 2017-08-02 NOTE — ED Notes (Addendum)
Provider notified that pt is driving home and has no one here to drive her home.

## 2017-10-26 ENCOUNTER — Ambulatory Visit (HOSPITAL_COMMUNITY)
Admission: EM | Admit: 2017-10-26 | Discharge: 2017-10-26 | Disposition: A | Discharge status: Home / Self Care | Payer: Medicaid Other | Attending: Family Medicine | Admitting: Family Medicine

## 2017-10-26 ENCOUNTER — Encounter (HOSPITAL_COMMUNITY): Payer: Self-pay | Admitting: Emergency Medicine

## 2017-10-26 DIAGNOSIS — B9689 Other specified bacterial agents as the cause of diseases classified elsewhere: Secondary | ICD-10-CM | POA: Insufficient documentation

## 2017-10-26 DIAGNOSIS — N898 Other specified noninflammatory disorders of vagina: Secondary | ICD-10-CM | POA: Diagnosis present

## 2017-10-26 DIAGNOSIS — Z3202 Encounter for pregnancy test, result negative: Secondary | ICD-10-CM | POA: Diagnosis not present

## 2017-10-26 DIAGNOSIS — F17210 Nicotine dependence, cigarettes, uncomplicated: Secondary | ICD-10-CM | POA: Insufficient documentation

## 2017-10-26 DIAGNOSIS — N760 Acute vaginitis: Secondary | ICD-10-CM | POA: Insufficient documentation

## 2017-10-26 LAB — POCT URINALYSIS DIP (DEVICE)
Bilirubin Urine: NEGATIVE
Glucose, UA: NEGATIVE mg/dL
Ketones, ur: NEGATIVE mg/dL
LEUKOCYTES UA: NEGATIVE
NITRITE: NEGATIVE
PH: 6.5 (ref 5.0–8.0)
Protein, ur: NEGATIVE mg/dL
Specific Gravity, Urine: >=1.03 (ref 1.005–1.030)
UROBILINOGEN UA: 0.2 mg/dL (ref 0.0–1.0)

## 2017-10-26 LAB — POCT PREGNANCY, URINE: PREG TEST UR: NEGATIVE

## 2017-10-26 MED ORDER — METRONIDAZOLE 500 MG PO TABS: 500.0000 mg | ORAL_TABLET | Freq: Two times a day (BID) | ORAL | 0 refills | Status: DC

## 2017-10-26 NOTE — ED Triage Notes (Signed)
PT reports vaginal discharge and odor for 1 month.

## 2017-10-26 NOTE — ED Provider Notes (Signed)
[facility]   [id number] 10/26/17 Arrival Time: 1150   SUBJECTIVE:  Erin Hernandez is a 27 y.o. female who presents to the urgent care with complaint of vaginal discharge and odor for 1 month.   No fever or abdominal pain.  Past Medical History:  Diagnosis Date  . Asthma   . Bipolar disorder (HCC)   . Suicide attempt [facility](HCC)    Family History  Problem Relation Age of Onset  . Hypertension Mother   . Hypertension Father   . Hypertension Maternal Grandmother   . Hypertension Maternal Grandfather   . Hypertension Maternal Aunt   . Alcohol abuse Maternal Aunt    Social History   Socioeconomic History  . Marital status: Single    Spouse name: Not on file  . Number of children: Not on file  . Years of education: Not on file  . Highest education level: Not on file  Social Needs  . Financial resource strain: Not on file  . Food insecurity - worry: Not on file  . Food insecurity - inability: Not on file  . Transportation needs - medical: Not on file  . Transportation needs - non-medical: Not on file  Occupational History  . Not on file  Tobacco Use  . Smoking status: Current Every Day Smoker    Packs/day: 0.50    Types: Cigarettes  . Smokeless tobacco: Never Used  Substance and Sexual Activity  . Alcohol use: No  . Drug use: No  . Sexual activity: Yes    Birth control/protection: None  Other Topics Concern  . Not on file  Social History Narrative  . Not on file   No outpatient medications have been marked as taking for the 10/26/17 encounter [facility](Hospital Encounter).   No Known Allergies    ROS: As per HPI, remainder of ROS negative.   OBJECTIVE:   Vitals:   10/26/17 1219 10/26/17 1221  BP:  139/82  Pulse:  74  Resp:  16  Temp:  98.4 F (36.9 C)  SpO2:  100%  Weight: 239 lb (108.4 kg)   Height: 5\' 5"  (1.651 m)      General appearance: alert; no distress Eyes: PERRL; EOMI; conjunctiva normal HENT: normocephalic; atraumatic; ; nasal mucosa  normal; oral mucosa normal Neck: supple Abdomen: soft, non-tender; bowel sounds normal; no masses or organomegaly; no guarding or rebound tenderness Pelvic: NEFG, light brown watery d/c, normal cvx Back: no CVA tenderness Extremities: no cyanosis or edema; symmetrical with no gross deformities Skin: warm and dry Neurologic: normal gait; grossly normal Psychological: alert and cooperative; normal mood and affect      Labs:  Results for orders placed or performed during the hospital encounter of 08/01/17  I-Stat Chem 8, ED  Result Value Ref Range   Sodium 137 135 - 145 mmol/L   Potassium 4.2 3.5 - 5.1 mmol/L   Chloride 101 101 - 111 mmol/L   BUN 11 6 - 20 mg/dL   Creatinine, Ser 3.290.80 0.44 - 1.00 mg/dL   Glucose, Bld 89 65 - 99 mg/dL   Calcium, Ion 5.181.10 (L) 1.15 - 1.40 mmol/L   TCO2 25 22 - 32 mmol/L   Hemoglobin 14.3 12.0 - 15.0 g/dL   HCT 84.142.0 66.036.0 - 63.046.0 %  I-Stat Beta hCG blood, ED ([facility], [facility], [facility] only)  Result Value Ref Range   I-stat hCG, quantitative <5.0 <5 mIU/mL   Comment 3            Labs Reviewed  URINE  CYTOLOGY ANCILLARY ONLY    No results found.     ASSESSMENT & PLAN:  1. BV (bacterial vaginosis)     Meds ordered this encounter  Medications  . metroNIDAZOLE (FLAGYL) 500 MG tablet    Sig: Take 1 tablet (500 mg total) by mouth 2 (two) times daily.    Dispense:  14 tablet    Refill:  0    Reviewed expectations re: course of current medical issues. Questions answered. Outlined signs and symptoms indicating need for more acute intervention. Patient verbalized understanding. After Visit Summary given.       Elvina Sidle, MD 10/26/17 1247

## 2017-10-27 LAB — CERVICOVAGINAL ANCILLARY ONLY
Bacterial vaginitis: POSITIVE — AB
Candida vaginitis: NEGATIVE
Chlamydia: NEGATIVE
Neisseria Gonorrhea: NEGATIVE
Trichomonas: NEGATIVE

## 2017-11-22 DIAGNOSIS — K115 Sialolithiasis: Secondary | ICD-10-CM | POA: Insufficient documentation

## 2017-12-13 ENCOUNTER — Other Ambulatory Visit: Payer: Self-pay | Admitting: Otolaryngology

## 2017-12-13 DIAGNOSIS — K115 Sialolithiasis: Secondary | ICD-10-CM

## 2017-12-13 DIAGNOSIS — K1120 Sialoadenitis, unspecified: Secondary | ICD-10-CM

## 2018-01-03 ENCOUNTER — Ambulatory Visit
Admission: RE | Admit: 2018-01-03 | Discharge: 2018-01-03 | Disposition: A | Discharge status: Home / Self Care | Payer: Medicaid Other | Source: Ambulatory Visit | Attending: Otolaryngology | Admitting: Otolaryngology

## 2018-01-03 DIAGNOSIS — K115 Sialolithiasis: Secondary | ICD-10-CM

## 2018-01-03 DIAGNOSIS — K1120 Sialoadenitis, unspecified: Secondary | ICD-10-CM

## 2018-01-03 MED ORDER — IOPAMIDOL (ISOVUE-300) INJECTION 61%
75.0000 mL | Freq: Once | INTRAVENOUS | Status: AC | PRN
  Administered 2018-01-03: 75 mL via INTRAVENOUS

## 2018-01-08 ENCOUNTER — Encounter (HOSPITAL_COMMUNITY): Payer: Self-pay | Admitting: *Deleted

## 2018-01-08 ENCOUNTER — Other Ambulatory Visit: Payer: Self-pay

## 2018-01-08 NOTE — Progress Notes (Signed)
[place] states that she has been having chest pain for approximately 6 months.  Patient reports that she 1st feels nauseous, thensharp chest pain, "around where my heart is," patient rates the pain 9 and states about that intense for 25 - 30 minutes- then goes away, patient also has shortness of breath and the last time she experienced it, lightheadedness. Patient states that usually she is smoking a cigarette or is angry.  Patient was seen in the emergency room 06/26/17 with complaints of chest pain. "They said it was nothing, but it does not feel like nothing."   Patient not been taking medication for bipolar for 3 months, "I thought I was pregnant, so I stopped then, but I am out , so I haven't been back to the Dr. Patient denies any sudicdial ideations.  I instructed patient to not smoke any more cigarettes until after surgery if she has to.  I am giving chart to anesthesia NT/PA to review.

## 2018-01-08 NOTE — Progress Notes (Signed)
Anesthesia Chart Review:  Pt is a same day work up.   Pt is a 27 year old female scheduled for L excision submandibular gland, possible intra-oral approaches for stone removal on 01/09/2018 with Billy FischerAmanda Marcellino, MD  PMH includes:  Asthma, bipolar disorder, GERD. Current smoker. BMI 39  Medications include lithium and latuda, both of which pt is out of and currently not taking.   Labs will be obtained day of surgery  CXR 06/26/17: No active cardiopulmonary disease.  EKG 06/26/17: NSR  CT soft tissue neck 01/03/18:  1. Persistent large 8-9 mm sialolith near the hilum of the left submandibular gland with continued, stable appearing low-to-intermediate level obstruction and inflammation of the gland. 2. No other sialolithiasis, and other salivary glands appear normal. 3. Dental caries.  Pt reports to pre-admission testing RN during pre-op phone that she has a 6 month history of chest pain.  Experiences nausea and sharp, severe chest pain for 25-30 minutes before spontaneously resolving.  Sometimes experiences SOB or dizziness with symptoms. Associated with smoking a cigarette or feeling angry.  Was evaluated for these symptoms in ED 06/26/17; ED provider documents "she did report some chest pain but it seems to radiate back and her throat. Her EKG and troponin are reassuring. Chest x-ray is negative."   Pt will need further assessment by assigned anesthesiologist day of surgery  Rica Mastngela Aarion Kittrell, FNP-BC [facility] Phone: [phone] 01/08/2018 2:00 PM

## 2018-01-09 ENCOUNTER — Encounter (HOSPITAL_COMMUNITY)
Admission: RE | Disposition: A | Discharge status: Home / Self Care | Payer: Self-pay | Source: Ambulatory Visit | Attending: Otolaryngology

## 2018-01-09 ENCOUNTER — Ambulatory Visit (HOSPITAL_COMMUNITY): Payer: Medicaid Other | Admitting: Certified Registered Nurse Anesthetist

## 2018-01-09 ENCOUNTER — Encounter (HOSPITAL_COMMUNITY): Payer: Self-pay | Admitting: Certified Registered Nurse Anesthetist

## 2018-01-09 ENCOUNTER — Ambulatory Visit (HOSPITAL_COMMUNITY)
Admission: RE | Admit: 2018-01-09 | Discharge: 2018-01-10 | Disposition: A | Discharge status: Home / Self Care | Payer: Medicaid Other | Source: Ambulatory Visit | Attending: Otolaryngology | Admitting: Otolaryngology

## 2018-01-09 DIAGNOSIS — K118 Other diseases of salivary glands: Secondary | ICD-10-CM | POA: Insufficient documentation

## 2018-01-09 DIAGNOSIS — F17200 Nicotine dependence, unspecified, uncomplicated: Secondary | ICD-10-CM | POA: Insufficient documentation

## 2018-01-09 DIAGNOSIS — K115 Sialolithiasis: Secondary | ICD-10-CM | POA: Insufficient documentation

## 2018-01-09 DIAGNOSIS — K1123 Chronic sialoadenitis: Secondary | ICD-10-CM | POA: Insufficient documentation

## 2018-01-09 DIAGNOSIS — E041 Nontoxic single thyroid nodule: Secondary | ICD-10-CM | POA: Diagnosis not present

## 2018-01-09 DIAGNOSIS — Z23 Encounter for immunization: Secondary | ICD-10-CM | POA: Diagnosis not present

## 2018-01-09 DIAGNOSIS — K219 Gastro-esophageal reflux disease without esophagitis: Secondary | ICD-10-CM | POA: Diagnosis not present

## 2018-01-09 DIAGNOSIS — K1120 Sialoadenitis, unspecified: Secondary | ICD-10-CM | POA: Diagnosis present

## 2018-01-09 HISTORY — PX: SUBMANDIBULAR GLAND EXCISION: SHX2456

## 2018-01-09 HISTORY — DX: Major depressive disorder, single episode, unspecified: F32.9

## 2018-01-09 HISTORY — DX: Gastro-esophageal reflux disease without esophagitis: K21.9

## 2018-01-09 HISTORY — DX: Depression, unspecified: F32.A

## 2018-01-09 HISTORY — DX: Schizophrenia, unspecified: F20.9

## 2018-01-09 HISTORY — DX: Dyspnea, unspecified: R06.00

## 2018-01-09 HISTORY — DX: Anxiety disorder, unspecified: F41.9

## 2018-01-09 HISTORY — DX: Unspecified intracranial injury with loss of consciousness of unspecified duration, initial encounter: S06.9X9A

## 2018-01-09 HISTORY — DX: Migraine, unspecified, not intractable, without status migrainosus: G43.909

## 2018-01-09 LAB — CBC
HEMATOCRIT: 37.8 % (ref 36.0–46.0)
HEMOGLOBIN: 12.7 g/dL (ref 12.0–15.0)
MCH: 29.1 pg (ref 26.0–34.0)
MCHC: 33.6 g/dL (ref 30.0–36.0)
MCV: 86.5 fL (ref 78.0–100.0)
Platelets: 247 10*3/uL (ref 150–400)
RBC: 4.37 MIL/uL (ref 3.87–5.11)
RDW: 15.2 % (ref 11.5–15.5)
WBC: 7 10*3/uL (ref 4.0–10.5)

## 2018-01-09 SURGERY — EXCISION, SUBMANDIBULAR GLAND
Anesthesia: General | Laterality: Left

## 2018-01-09 MED ORDER — HYDROMORPHONE HCL 2 MG/ML IJ SOLN
INTRAMUSCULAR | Status: AC
  Administered 2018-01-09: 0.5 mg via INTRAVENOUS
  Filled 2018-01-09: qty 1

## 2018-01-09 MED ORDER — HYDROCODONE-ACETAMINOPHEN 5-325 MG PO TABS: 1.0000 | ORAL_TABLET | ORAL | 0 refills | Status: AC | PRN

## 2018-01-09 MED ORDER — 0.9 % SODIUM CHLORIDE (POUR BTL) OPTIME
TOPICAL | Status: DC | PRN
  Administered 2018-01-09: 1000 mL

## 2018-01-09 MED ORDER — SUGAMMADEX SODIUM 200 MG/2ML IV SOLN
INTRAVENOUS | Status: AC
  Filled 2018-01-09: qty 2

## 2018-01-09 MED ORDER — ONDANSETRON HCL 4 MG/2ML IJ SOLN
INTRAMUSCULAR | Status: DC | PRN
  Administered 2018-01-09: 4 mg via INTRAVENOUS

## 2018-01-09 MED ORDER — LIDOCAINE-EPINEPHRINE 1 %-1:100000 IJ SOLN
INTRAMUSCULAR | Status: DC | PRN
  Administered 2018-01-09: 10 mL

## 2018-01-09 MED ORDER — PROPOFOL 10 MG/ML IV BOLUS
INTRAVENOUS | Status: AC
  Filled 2018-01-09: qty 20

## 2018-01-09 MED ORDER — MIDAZOLAM HCL 2 MG/2ML IJ SOLN
INTRAMUSCULAR | Status: AC
  Filled 2018-01-09: qty 2

## 2018-01-09 MED ORDER — ONDANSETRON HCL 4 MG/2ML IJ SOLN
INTRAMUSCULAR | Status: AC
  Filled 2018-01-09: qty 2

## 2018-01-09 MED ORDER — DEXAMETHASONE SODIUM PHOSPHATE 10 MG/ML IJ SOLN
INTRAMUSCULAR | Status: AC
  Filled 2018-01-09: qty 1

## 2018-01-09 MED ORDER — ONDANSETRON HCL 4 MG/2ML IJ SOLN: 4.0000 mg | INTRAMUSCULAR | Status: DC | PRN

## 2018-01-09 MED ORDER — DEXAMETHASONE SODIUM PHOSPHATE 10 MG/ML IJ SOLN
INTRAMUSCULAR | Status: DC | PRN
  Administered 2018-01-09: 10 mg via INTRAVENOUS

## 2018-01-09 MED ORDER — ROCURONIUM BROMIDE 10 MG/ML (PF) SYRINGE
PREFILLED_SYRINGE | INTRAVENOUS | Status: DC | PRN
  Administered 2018-01-09: 50 mg via INTRAVENOUS

## 2018-01-09 MED ORDER — MORPHINE SULFATE (PF) 4 MG/ML IV SOLN
INTRAVENOUS | Status: AC
  Administered 2018-01-09: 2 mg
  Filled 2018-01-09: qty 1

## 2018-01-09 MED ORDER — LIDOCAINE-EPINEPHRINE 1 %-1:100000 IJ SOLN
INTRAMUSCULAR | Status: AC
  Filled 2018-01-09: qty 1

## 2018-01-09 MED ORDER — NIACIN 500 MG PO TABS
500.0000 mg | ORAL_TABLET | Freq: Every day | ORAL | Status: DC
  Administered 2018-01-09: 500 mg via ORAL
  Filled 2018-01-09 (×3): qty 1

## 2018-01-09 MED ORDER — LIDOCAINE 2% (20 MG/ML) 5 ML SYRINGE
INTRAMUSCULAR | Status: DC | PRN
  Administered 2018-01-09: 100 mg via INTRAVENOUS

## 2018-01-09 MED ORDER — CEPHALEXIN 500 MG PO CAPS: 500.0000 mg | ORAL_CAPSULE | Freq: Four times a day (QID) | ORAL | 0 refills | Status: AC

## 2018-01-09 MED ORDER — PHENYLEPHRINE 40 MCG/ML (10ML) SYRINGE FOR IV PUSH (FOR BLOOD PRESSURE SUPPORT)
PREFILLED_SYRINGE | INTRAVENOUS | Status: AC
  Filled 2018-01-09: qty 10

## 2018-01-09 MED ORDER — HYDROCODONE-ACETAMINOPHEN 5-325 MG PO TABS
ORAL_TABLET | ORAL | Status: AC
  Administered 2018-01-09: 2
  Filled 2018-01-09: qty 2

## 2018-01-09 MED ORDER — FENTANYL CITRATE (PF) 250 MCG/5ML IJ SOLN
INTRAMUSCULAR | Status: DC | PRN
  Administered 2018-01-09 (×5): 50 ug via INTRAVENOUS
  Administered 2018-01-09: 100 ug via INTRAVENOUS
  Administered 2018-01-09 (×3): 50 ug via INTRAVENOUS

## 2018-01-09 MED ORDER — DOCUSATE SODIUM 100 MG PO CAPS
100.0000 mg | ORAL_CAPSULE | Freq: Two times a day (BID) | ORAL | Status: DC
  Administered 2018-01-09 – 2018-01-10 (×2): 100 mg via ORAL
  Filled 2018-01-09 (×2): qty 1

## 2018-01-09 MED ORDER — SUGAMMADEX SODIUM 200 MG/2ML IV SOLN
INTRAVENOUS | Status: DC | PRN
  Administered 2018-01-09: 200 mg via INTRAVENOUS

## 2018-01-09 MED ORDER — DEXTROSE-NACL 5-0.9 % IV SOLN
INTRAVENOUS | Status: DC
  Administered 2018-01-09: 16:00:00 via INTRAVENOUS

## 2018-01-09 MED ORDER — PHENYLEPHRINE 40 MCG/ML (10ML) SYRINGE FOR IV PUSH (FOR BLOOD PRESSURE SUPPORT)
PREFILLED_SYRINGE | INTRAVENOUS | Status: DC | PRN
  Administered 2018-01-09: 80 ug via INTRAVENOUS
  Administered 2018-01-09: 40 ug via INTRAVENOUS

## 2018-01-09 MED ORDER — MORPHINE SULFATE (PF) 2 MG/ML IV SOLN: 2.0000 mg | INTRAVENOUS | Status: DC | PRN

## 2018-01-09 MED ORDER — FENTANYL CITRATE (PF) 250 MCG/5ML IJ SOLN
INTRAMUSCULAR | Status: AC
  Filled 2018-01-09: qty 5

## 2018-01-09 MED ORDER — PROPOFOL 10 MG/ML IV BOLUS
INTRAVENOUS | Status: DC | PRN
  Administered 2018-01-09: 20 mg via INTRAVENOUS
  Administered 2018-01-09: 200 mg via INTRAVENOUS

## 2018-01-09 MED ORDER — MEPERIDINE HCL 50 MG/ML IJ SOLN: 6.2500 mg | INTRAMUSCULAR | Status: DC | PRN

## 2018-01-09 MED ORDER — ROCURONIUM BROMIDE 10 MG/ML (PF) SYRINGE
PREFILLED_SYRINGE | INTRAVENOUS | Status: AC
  Filled 2018-01-09: qty 5

## 2018-01-09 MED ORDER — HYDROMORPHONE HCL 2 MG/ML IJ SOLN
0.2500 mg | INTRAMUSCULAR | Status: DC | PRN
  Administered 2018-01-09 (×4): 0.5 mg via INTRAVENOUS

## 2018-01-09 MED ORDER — HYDROCODONE-ACETAMINOPHEN 5-325 MG PO TABS
1.0000 | ORAL_TABLET | ORAL | Status: DC | PRN
  Administered 2018-01-09 – 2018-01-10 (×6): 2 via ORAL
  Filled 2018-01-09 (×6): qty 2

## 2018-01-09 MED ORDER — LACTATED RINGERS IV SOLN
INTRAVENOUS | Status: DC | PRN
  Administered 2018-01-09 (×2): via INTRAVENOUS

## 2018-01-09 MED ORDER — ONDANSETRON HCL 4 MG PO TABS: 4.0000 mg | ORAL_TABLET | ORAL | Status: DC | PRN

## 2018-01-09 MED ORDER — CEFAZOLIN SODIUM-DEXTROSE 2-3 GM-%(50ML) IV SOLR
INTRAVENOUS | Status: DC | PRN
  Administered 2018-01-09: 2 g via INTRAVENOUS

## 2018-01-09 MED ORDER — BACITRACIN ZINC 500 UNIT/GM EX OINT
TOPICAL_OINTMENT | CUTANEOUS | Status: AC
  Filled 2018-01-09: qty 28.35

## 2018-01-09 MED ORDER — PNEUMOCOCCAL VAC POLYVALENT 25 MCG/0.5ML IJ INJ
0.5000 mL | INJECTION | INTRAMUSCULAR | Status: AC
  Administered 2018-01-10: 0.5 mL via INTRAMUSCULAR
  Filled 2018-01-09: qty 0.5

## 2018-01-09 MED ORDER — ONDANSETRON HCL 4 MG/2ML IJ SOLN: 4.0000 mg | Freq: Once | INTRAMUSCULAR | Status: DC | PRN

## 2018-01-09 MED ORDER — CEFAZOLIN SODIUM-DEXTROSE 2-4 GM/100ML-% IV SOLN
INTRAVENOUS | Status: AC
  Filled 2018-01-09: qty 100

## 2018-01-09 MED ORDER — LIDOCAINE 2% (20 MG/ML) 5 ML SYRINGE
INTRAMUSCULAR | Status: AC
  Filled 2018-01-09: qty 5

## 2018-01-09 MED ORDER — MIDAZOLAM HCL 5 MG/5ML IJ SOLN
INTRAMUSCULAR | Status: DC | PRN
  Administered 2018-01-09: 2 mg via INTRAVENOUS

## 2018-01-09 MED ORDER — BACITRACIN ZINC 500 UNIT/GM EX OINT
1.0000 "application " | TOPICAL_OINTMENT | Freq: Three times a day (TID) | CUTANEOUS | Status: DC
  Administered 2018-01-09 – 2018-01-10 (×3): 1 via TOPICAL
  Filled 2018-01-09: qty 28.35

## 2018-01-09 SURGICAL SUPPLY — 51 items
ATTRACTOMAT 16X20 MAGNETIC DRP (DRAPES) IMPLANT
BLADE CLIPPER SURG (BLADE) IMPLANT
BLADE SURG 15 STRL LF DISP TIS (BLADE) IMPLANT
BLADE SURG 15 STRL SS (BLADE)
CANISTER SUCT 3000ML PPV (MISCELLANEOUS) ×2 IMPLANT
CLEANER TIP ELECTROSURG 2X2 (MISCELLANEOUS) ×2 IMPLANT
CONT SPEC 4OZ CLIKSEAL STRL BL (MISCELLANEOUS) ×2 IMPLANT
CORDS BIPOLAR (ELECTRODE) ×1 IMPLANT
COVER SURGICAL LIGHT HANDLE (MISCELLANEOUS) ×2 IMPLANT
CRADLE DONUT ADULT HEAD (MISCELLANEOUS) IMPLANT
DRAIN SNY 10 ROU (WOUND CARE) ×2 IMPLANT
DRAPE HALF SHEET 40X57 (DRAPES) IMPLANT
DRSG TEGADERM 4X4.75 (GAUZE/BANDAGES/DRESSINGS) ×1 IMPLANT
ELECT COATED BLADE 2.86 ST (ELECTRODE) ×2 IMPLANT
ELECT PAIRED SUBDERMAL (MISCELLANEOUS) ×2
ELECT REM PT RETURN 9FT ADLT (ELECTROSURGICAL) ×2
ELECTRODE PAIRED SUBDERMAL (MISCELLANEOUS) IMPLANT
ELECTRODE REM PT RTRN 9FT ADLT (ELECTROSURGICAL) ×1 IMPLANT
EVACUATOR SILICONE 100CC (DRAIN) IMPLANT
GAUZE SPONGE 4X4 16PLY XRAY LF (GAUZE/BANDAGES/DRESSINGS) IMPLANT
GLOVE BIO SURGEON STRL SZ 6.5 (GLOVE) ×1 IMPLANT
GLOVE ECLIPSE 8.0 STRL XLNG CF (GLOVE) ×2 IMPLANT
GOWN STRL REUS W/ TWL LRG LVL3 (GOWN DISPOSABLE) ×2 IMPLANT
GOWN STRL REUS W/ TWL XL LVL3 (GOWN DISPOSABLE) ×1 IMPLANT
GOWN STRL REUS W/TWL LRG LVL3 (GOWN DISPOSABLE) ×4
GOWN STRL REUS W/TWL XL LVL3 (GOWN DISPOSABLE) ×2
HEMOSTAT ARISTA ABSORB 3G PWDR (MISCELLANEOUS) ×1 IMPLANT
KIT BASIN OR (CUSTOM PROCEDURE TRAY) ×2 IMPLANT
KIT TURNOVER KIT B (KITS) ×2 IMPLANT
LOCATOR NERVE 3 VOLT (DISPOSABLE) IMPLANT
MARKER SKIN DUAL TIP RULER LAB (MISCELLANEOUS) ×1 IMPLANT
NDL HYPO 25GX1X1/2 BEV (NEEDLE) ×1 IMPLANT
NEEDLE HYPO 25GX1X1/2 BEV (NEEDLE) ×2 IMPLANT
NS IRRIG 1000ML POUR BTL (IV SOLUTION) ×2 IMPLANT
PAD ARMBOARD 7.5X6 YLW CONV (MISCELLANEOUS) ×4 IMPLANT
PENCIL BUTTON HOLSTER BLD 10FT (ELECTRODE) ×3 IMPLANT
PROBE NERVBE PRASS .33 (MISCELLANEOUS) ×1 IMPLANT
STAPLER VISISTAT 35W (STAPLE) ×2 IMPLANT
SUT CHROMIC 4 0 PS 2 18 (SUTURE) IMPLANT
SUT ETHILON 3 0 PS 1 (SUTURE) ×2 IMPLANT
SUT ETHILON 5 0 PS 2 18 (SUTURE) IMPLANT
SUT PROLENE 5 0 PS 2 (SUTURE) ×1 IMPLANT
SUT SILK 0 TIES 10X30 (SUTURE) IMPLANT
SUT SILK 3 0 REEL (SUTURE) ×1 IMPLANT
SUT SILK 4 0 REEL (SUTURE) IMPLANT
SUT VIC AB 3-0 FS2 27 (SUTURE) ×1 IMPLANT
SUT VIC AB 4-0 PS2 18 (SUTURE) ×1 IMPLANT
SYR CONTROL 10ML LL (SYRINGE) ×2 IMPLANT
TOWEL OR 17X24 6PK STRL BLUE (TOWEL DISPOSABLE) ×2 IMPLANT
TRAY ENT MC OR (CUSTOM PROCEDURE TRAY) ×2 IMPLANT
TUBE CONNECTING 12X1/4 (SUCTIONS) IMPLANT

## 2018-01-09 NOTE — Progress Notes (Addendum)
Received pt from PACU, A&O x4; complaining of pain in throat and R face 9/10. Sutures intact, dry.  Pt has history of suicide attempts, last one in past 30 days. Initiated suicide risk protocol. Safety sitter in room   Pt became very upset, denied suicide attempt in last 30 days. States has no thoughts of harming herself. Dr Annalee GentaShoemaker notified.

## 2018-01-09 NOTE — Anesthesia Preprocedure Evaluation (Addendum)
Anesthesia Evaluation  Patient identified by MRN, date of birth, ID band Patient awake    Reviewed: Allergy & Precautions, NPO status , Patient's Chart, lab work & pertinent test results  Airway Mallampati: II  TM Distance: >3 FB Neck ROM: Full    Dental  (+) Teeth Intact, Dental Advisory Given,    Pulmonary Current Smoker,    Pulmonary exam normal        Cardiovascular Normal cardiovascular exam     Neuro/Psych Anxiety Bipolar Disorder    GI/Hepatic GERD  Medicated and Controlled,  Endo/Other    Renal/GU      Musculoskeletal   Abdominal   Peds  Hematology   Anesthesia Other Findings   Reproductive/Obstetrics                            Anesthesia Physical Anesthesia Plan  ASA: II  Anesthesia Plan: General   Post-op Pain Management:    Induction: Intravenous  PONV Risk Score and Plan: 2 and Ondansetron, Dexamethasone and Midazolam  Airway Management Planned: Oral ETT  Additional Equipment:   Intra-op Plan:   Post-operative Plan: Extubation in OR  Informed Consent: I have reviewed the patients History and Physical, chart, labs and discussed the procedure including the risks, benefits and alternatives for the proposed anesthesia with the patient or authorized representative who has indicated his/her understanding and acceptance.     Plan Discussed with: CRNA and Surgeon  Anesthesia Plan Comments:         Anesthesia Quick Evaluation

## 2018-01-09 NOTE — Progress Notes (Signed)
ENT Post Operative Note  Subjective: No nausea, vomiting No difficulty voiding Pain not controlled, somewhat out of proportion to exam findings.   Vitals:   01/09/18 1347 01/09/18 1400  BP: 120/67 138/61  Pulse: 84 78  Resp: 16 17  Temp: (!) 97.2 F (36.2 C) 98.2 F (36.8 C)  SpO2: 93% 99%     OBJECTIVE  Gen: alert, somewhat uncooperative, appropriate Head/ENT: EOMI, neck supple, mucus membranes moist and pink, conjunctiva clear Incisions c/d/i Face moves symmetrically Respiratory: Voice without dysphonia. non-labored breathing, no accessory muscle use, normal HR, good O2 saturations   ASSESS/ PLAN  Erin Hernandez is a 27 y.o. female who is POD 0 from left submandibular gland excision.  -pain control -MIVF- will saline lock when tolerating PO intake -wound car- BID bacitracin, suture removal in one week Norco and morphine available for pain Ice packs as tolerated Stays tonight, home in the a.m.

## 2018-01-09 NOTE — Anesthesia Procedure Notes (Signed)
Procedure Name: Intubation Date/Time: 01/09/2018 7:47 AM Performed by: Nils PyleBell, Aurielle Slingerland T, CRNA Pre-anesthesia Checklist: Patient identified, Emergency Drugs available, Suction available and Patient being monitored Patient Re-evaluated:Patient Re-evaluated prior to induction Oxygen Delivery Method: Circle System Utilized Preoxygenation: Pre-oxygenation with 100% oxygen Induction Type: IV induction Ventilation: Mask ventilation without difficulty and Oral airway inserted - appropriate to patient size Laryngoscope Size: Hyacinth MeekerMiller and 2 Grade View: Grade I Tube type: Oral Tube size: 7.0 mm Number of attempts: 1 Airway Equipment and Method: Stylet and Oral airway Placement Confirmation: ETT inserted through vocal cords under direct vision,  positive ETCO2 and breath sounds checked- equal and bilateral Secured at: 22 cm Tube secured with: Tape Dental Injury: Teeth and Oropharynx as per pre-operative assessment

## 2018-01-09 NOTE — Op Note (Signed)
DATE OF PROCEDURE:  01/09/2018    PRE-OPERATIVE DIAGNOSIS:  LEFT SUBMANDIBULAR CHRONIC SIALADENTIS SIALOLITHIASIS    POST-OPERATIVE DIAGNOSIS:  Same    PROCEDURE(S): Left submandibular gland excision   SURGEON:  Misty StanleyAmanda Jo Marcellino, MD    ASSISTANT(S):  Flo ShanksKarol Wolicki, MD    ANESTHESIA:  General endotracheal anesthesia      ESTIMATED BLOOD LOSS:  10mL   SPECIMENS:  Left submandibular gland    COMPLICATIONS:  None    OPERATIVE FINDINGS:  The left submandibular gland did demonstrate some peri-glandular inflammation and scarring. Specimen did contain the stone in the duct, which appeared to be just under a centimeter.     OPERATIVE DETAILS: The patient was brought to the operating room and placed in the supine position. General anesthesia was induced. A timeout was performed. The NIM monitor was placed to monitor the left marginal mandibular nerve. NIM was confirmed to be working. A skin insicion a little over 2 fingerbreadths below the mandible was designed in a horizontal skin crease. We carried the incision through the skin, subcutaneous tissues and platysma. We elevated subplatysmal flaps in the inferior and superior directions.  We identified the inferior aspect of the gland, elevated the fascia off its lateral aspect in efforts to protect the marginal mandibular branch of the facial nerve. Careful dissection was performed around the circumference of the gland, dividing small blood vessel feeders from the facial artery and vein to the gland as they were encountered. We identified the mylohyoid muscle at the anterior aspect of the gland and retracted this anteriorly. This allowed us to identify the submandibular duct and lingual nerve.  The submandibular duct and ganglion were then divided, thus freeing the submandibular gland from the neck. Stone was confirmed to be located within the specimen. The neck was copiously irrigated. Hemostasis achieved with bipolar  cautery. Valsalva was performed. Arista was placed. The platysma was re-approximated with 3-0 Vicryls. Dermis closed with buried interrupted 4-0 Vicryls. Skin closed with running locking 5-0 Prolene. Skin was cleansed. All instrumentation was removed. Bacitracin was applied. The patient was then awakened and transported to PACU in good condition.

## 2018-01-09 NOTE — Plan of Care (Signed)
  Problem: Clinical Measurements: Goal: Postoperative complications will be avoided or minimized Outcome: Progressing   

## 2018-01-09 NOTE — Transfer of Care (Signed)
Immediate Anesthesia Transfer of Care Note  Patient: Erin Hernandez  Procedure(s) Performed: LEFT EXCISION SUBMANDIBULAR GLAND,POSSIBLE INTRA ORAL APPROACH FOR STONE REMOVAL (Left )  Patient Location: PACU  Anesthesia Type:General  Level of Consciousness: awake, alert  and oriented  Airway & Oxygen Therapy: Patient Spontanous Breathing and Patient connected to nasal cannula oxygen  Post-op Assessment: Report given to RN, Post -op Vital signs reviewed and stable and Patient moving all extremities X 4  Post vital signs: Reviewed and stable  Last Vitals:  Vitals Value Taken Time  BP 124/112 01/09/2018  9:40 AM  Temp    Pulse 99 01/09/2018  9:41 AM  Resp 14 01/09/2018  9:41 AM  SpO2 93 % 01/09/2018  9:41 AM  Vitals shown include unvalidated device data.  Last Pain:  Vitals:   01/09/18 0613  TempSrc: Oral         Complications: No apparent anesthesia complications

## 2018-01-09 NOTE — Progress Notes (Signed)
   ENT Progress Note: s/p Procedure(s): LEFT EXCISION SUBMANDIBULAR GLAND,POSSIBLE INTRA ORAL APPROACH FOR STONE REMOVAL   Subjective: C/O of pain  Objective: Vital signs in last 24 hours: Temp:  [97.2 F (36.2 C)-98.2 F (36.8 C)] 98.2 F (36.8 C) (04/16 1400) Pulse Rate:  [72-101] 78 (04/16 1400) Resp:  [12-22] 17 (04/16 1400) BP: (120-141)/(60-103) 138/61 (04/16 1400) SpO2:  [92 %-100 %] 99 % (04/16 1400) Weight:  [106.6 kg (235 lb)] 106.6 kg (235 lb) (04/16 0645) Weight change:     Intake/Output from previous day: No intake/output data recorded. Intake/Output this shift: Total I/O In: 1680 [P.O.:480; I.V.:1000; Other:200] Out: 100 [Blood:100]  Labs: Recent Labs    01/09/18 0658  WBC 7.0  HGB 12.7  HCT 37.8  PLT 247   No results for input(s): NA, K, CL, CO2, GLUCOSE, BUN, CALCIUM in the last 72 hours.  Invalid input(s): CREATININR  Studies/Results: No results found.   PHYSICAL EXAM: Inc intact, no erythema or swelling Facial nerve intact    Assessment/Plan: Stable postop Cont pain management Liquid and soft diet as tolerated    Erin Hernandez 01/09/2018, 4:20 PM

## 2018-01-09 NOTE — Anesthesia Postprocedure Evaluation (Signed)
Anesthesia Post Note  Patient: Erin Hernandez  Procedure(s) Performed: LEFT EXCISION SUBMANDIBULAR GLAND,POSSIBLE INTRA ORAL APPROACH FOR STONE REMOVAL (Left )     Patient location during evaluation: PACU Anesthesia Type: General Level of consciousness: awake and alert Pain management: pain level controlled Vital Signs Assessment: post-procedure vital signs reviewed and stable Respiratory status: spontaneous breathing, nonlabored ventilation, respiratory function stable and patient connected to nasal cannula oxygen Cardiovascular status: blood pressure returned to baseline and stable Postop Assessment: no apparent nausea or vomiting Anesthetic complications: no    Last Vitals:  Vitals:   01/09/18 1347 01/09/18 1400  BP: 120/67 138/61  Pulse: 84 78  Resp: 16 17  Temp: (!) 36.2 C 36.8 C  SpO2: 93% 99%    Last Pain:  Vitals:   01/09/18 1400  TempSrc: Oral  PainSc: 9                  Jasminemarie Sherrard DAVID

## 2018-01-09 NOTE — H&P (Signed)
The surgical history remains accurate and without interval change. The condition still exists which makes the procedure necessary. The patient and/or family is aware of their condition and has been informed of the risks and benefits of surgery, as well as alternatives. All parties have elected to proceed with surgery.   Subjective: Ms. Erin Hernandez is a 27 y.o. female seen in follow up today for left submandibular sialolithiasis and sialoadenitis. She was unable complete her clindamycin course due to side effect diarrhea. She took this medication for about 3 days and then stopped. She has been sucking on lemons regularly, do warm cuts as well as massage. She continues to experience intermittent swelling of her left submandibular gland. It is very painful for her. She also has a stone in the posterior portion of her mouth that she cannot feel. She said the symptoms on and off for about a year.  Past Medical History:  Diagnosis Date  . Bipolar 1 disorder (HCC)  . Schizo affective schizophrenia (HCC)   No past surgical history on file. No prior surgeries No family history on file. No family history of any thyroid cancers. No bleeding disorders or problems with anesthesia Social History   Social History  . Marital status: N/A  Spouse name: N/A  . Number of children: N/A  . Years of education: N/A   Occupational History  . Not on file.   Social History Main Topics  . Smoking status: Current Every Day Smoker  . Smokeless tobacco: Never Used  . Alcohol use Yes  . Drug use: No  . Sexual activity: Not on file   Other Topics Concern  . Not on file   Social History Narrative  . No narrative on file   Not on File Updated Medication List:   amoxicillin-clavulanate (AUGMENTIN) 875-125 mg per tablet  Sig - Route: Take 1 tablet by mouth 2 times daily for 7 days. - Oral    ROS A complete review of systems was conducted and was negative except as stated in the HPI.    Objective:  Vitals:   01/09/18 0613  BP: 122/68  Pulse: 72  Resp: 18  Temp: 98.2 F (36.8 C)  SpO2: 100%    Physical Exam:  General Normocephalic, Awake, Alert Obese  Eyes PERRL, no scleral icterus or conjunctival hemorrhage. EOMI.  Ears Right ear- canal patent, minimal cerumen. TM intact, no significant retractions, no middle ear effusion, normal landmarks.  Left ear- canal patent, minimal cerumen. TM intact, no significant retractions, no middle ear effusion, normal landmarks.  Nose Patent, No polyps or masses seen.  Oral Pharynx No mucosal lesions or tumors seen. Along the posterior portion of the floor of mouth there is a palpable stone at the tip of my finger. This appears to be about 1 cm and unchanged from prior exam. I do not think this would be amenable to excision in clinic as it is very far back and her floor of mouth. This is somewhat tender to touch. I am able to extrude saliva still through Wharton's duct around this.  Lymphatics left seminal gland is significantly firm. Slightly smaller as compared to last examination  Endocrine multiple thyroid cysts. Mild thyromegaly  Cardio-vascular No cyanosis, cardiac auscultation - regular rate, no murmur  Pulmonary No audible stridor, Breathing easily with no labor.  Neuro Symmetric facial movement. Tongue protrudes in midline.  Psychiatry Appropriate affect and mood for clinic visit.  Skin No scars or lesions on face or neck.  Assessment:  My impression is that Erin Hernandez has  1. Sialadenitis  2. Sialolithiasis of submandibular gland  3. Thyroid nodule  .    Plan:   We will plan for left submandibular gland excision with possible combined intraoral approach for stone removal as well. Discussed the risks and benefits of surgery including pain, infection, damage to surrounding structures including arteries,  veins or nerves, specifically discussed there is marginal mandibular nerve, possible need for further surgeries,  possible combined intraoral approach.

## 2018-01-09 NOTE — Discharge Instructions (Signed)
-  Cleanse wound with 1/2 strength hydrogen peroxide on a Q tip twice daily. Pat dry. Apply bacitracin ointment along incision.  °-Keep drain site clean in a similar fashion.  °-You may shower like you normally would. Do not scrub over incision. Pat dry.  °-No strenuous exercise or heavy lifting until 14 days after surgery. Walk/ambulate at home often. Perform calf and stretch exercises often to prevent blood clot formation.  °-Do not take more pain medication than prescribed. You should begin slowly weaning off the pain medication. No refills will be prescribed.  °

## 2018-01-10 ENCOUNTER — Encounter (HOSPITAL_COMMUNITY): Payer: Self-pay | Admitting: Otolaryngology

## 2018-01-10 DIAGNOSIS — K115 Sialolithiasis: Secondary | ICD-10-CM | POA: Diagnosis not present

## 2018-01-10 NOTE — Progress Notes (Signed)
Chaplain came to Pt.'s room on rounds and by way of Spiritual Care consult. At the time of  Visit Erin Hernandez expressed that she was in significant pain and had just received some pain medication. She asked me to return at a later time.  She smiled at the idea of having someone to simply listen. I look forward to returning to visit.

## 2018-01-10 NOTE — Progress Notes (Signed)
[place] to be D/C'd  per MD order. Discussed with the patient and all questions fully answered.  VSS, Skin clean, dry and intact without evidence of skin break down, no evidence of skin tears noted.  IV catheter discontinued intact. Site without signs and symptoms of complications. Dressing and pressure applied.  An After Visit Summary was printed and given to the patient. Patient received prescription.  D/c education completed with patient/family including follow up instructions, medication list, d/c activities limitations if indicated, with other d/c instructions as indicated by MD - patient able to verbalize understanding, all questions fully answered.   Patient instructed to return to ED, call 911, or call MD for any changes in condition.   Patient D/C home via private auto.

## 2018-01-10 NOTE — Discharge Summary (Signed)
Discharge Summary  Patient Details  Name: Erin Hernandez MRN: [id number] DOB: [birth year]  DISCHARGE SUMMARY    Dates of Hospitalization: 01/09/2018 to 01/10/2018  Reason for Hospitalization: Post operative care  Problem List: Active Problems:   Sialadenitis   Final Diagnoses: left submandibular chronic sialadenitis and sialolithiasis  Brief Hospital Course:  Patient underwent left submandibular gland excision on 01/09/18. Stayed overnight due to swelling and pain. On POD1, she was monitored for a period of time. Swelling decreased. Pain improved. Ambulating independently, tolerating regular diet, pain controlled.   Discharge Weight: 106.6 kg (235 lb)   Discharge Condition: Improved  Discharge Diet: Resume diet  Discharge Activity: Ad lib   Procedures/Operations: left submandibular gland excision Consultants: none  Discharge Medication List  Allergies as of 01/10/2018   No Known Allergies     Medication List    TAKE these medications   cephALEXin 500 MG capsule Commonly known as:  KEFLEX Take 1 capsule (500 mg total) by mouth 4 (four) times daily for 5 days.   HYDROcodone-acetaminophen 5-325 MG tablet Commonly known as:  NORCO/VICODIN Take 1 tablet by mouth every 4 (four) hours as needed for up to 7 days for moderate pain.   LATUDA PO Take by mouth.   LITHIUM PO Take by mouth.   niacin 500 MG tablet Take 500 mg by mouth at bedtime.       Follow Up Issues/Recommendations: Follow-up Information    Graylin ShiverMarcellino, Amanda J, MD. Schedule an appointment as soon as possible for a visit on 01/16/2018.   Specialty:  Otolaryngology Why:  For wound re-check, For suture removal Contact information: [address] [place] [address] [phone]           Graylin Shivermanda J Marcellino 01/10/2018, 4:22 PM

## 2018-01-10 NOTE — Social Work (Signed)
CSW acknowledging consult for pt mental health consult. Pt no longer has suicide precautions. If concern for pt needing psychiatric evaluation please consult behavioral health. CSW will attempt to f/u with pt, per chaplain note pt is currently having pain following procedure.  Erin HutchingIsabel Hernandez Cheryl Stabenow, LCSWA [facility] Clinical Social Work [phone]

## 2018-03-20 ENCOUNTER — Other Ambulatory Visit: Payer: Self-pay

## 2018-03-20 ENCOUNTER — Encounter (HOSPITAL_COMMUNITY): Payer: Self-pay | Admitting: Emergency Medicine

## 2018-03-20 ENCOUNTER — Emergency Department (HOSPITAL_COMMUNITY)
Admission: EM | Admit: 2018-03-20 | Discharge: 2018-03-20 | Disposition: A | Discharge status: Home / Self Care | Payer: Medicaid Other | Attending: Emergency Medicine | Admitting: Emergency Medicine

## 2018-03-20 DIAGNOSIS — R51 Headache: Secondary | ICD-10-CM | POA: Diagnosis present

## 2018-03-20 DIAGNOSIS — F17210 Nicotine dependence, cigarettes, uncomplicated: Secondary | ICD-10-CM | POA: Insufficient documentation

## 2018-03-20 DIAGNOSIS — G43909 Migraine, unspecified, not intractable, without status migrainosus: Secondary | ICD-10-CM

## 2018-03-20 DIAGNOSIS — J45909 Unspecified asthma, uncomplicated: Secondary | ICD-10-CM | POA: Insufficient documentation

## 2018-03-20 MED ORDER — METOCLOPRAMIDE HCL 10 MG PO TABS
5.0000 mg | ORAL_TABLET | Freq: Once | ORAL | Status: AC
Start: 2018-03-20 — End: 2018-03-20
  Administered 2018-03-20: 5 mg via ORAL
  Filled 2018-03-20: qty 1

## 2018-03-20 MED ORDER — DIPHENHYDRAMINE HCL 25 MG PO CAPS
25.0000 mg | ORAL_CAPSULE | Freq: Once | ORAL | Status: DC
  Filled 2018-03-20: qty 1

## 2018-03-20 MED ORDER — KETOROLAC TROMETHAMINE 30 MG/ML IJ SOLN
30.0000 mg | Freq: Once | INTRAMUSCULAR | Status: AC
  Administered 2018-03-20: 30 mg via INTRAMUSCULAR
  Filled 2018-03-20: qty 1

## 2018-03-20 NOTE — ED Provider Notes (Signed)
[facility] Provider Note   CSN: [id number] Arrival date & time: 03/20/18  1221     History   Chief Complaint Chief Complaint  Patient presents with  . Headache    HPI [place] is a 27 y.o. female.  HPI   27 year old female presented today with complaints of headache.  Patient notes 6-day history of frontal throbbing headache behind her eyes.  Patient notes a history of migraines but notes this is slightly more severe and more persistent than previous migraines.  She notes taking ibuprofen at home which originally improved her symptoms but is no longer helping.  She denies any fever, trauma, red flags, or any other concerns here today.  Patient has associated blurred vision and light sensitivity.  Patient does note that she has not been drinking free water at home, she also notes that she spilled gas in her car and believes that the odor may be triggering her migraine.       Past Medical History:  Diagnosis Date  . Anxiety   . Asthma   . Bipolar disorder (HCC)   . Depression   . Dyspnea    With exertion  . GERD (gastroesophageal reflux disease)    ocassional - takes tumbs if needed.  . Head injury with loss of consciousness (HCC) 2011  . Migraine    "a few/wk" (01/09/2018)  . Schizophrenia (HCC)   . Suicide attempt [facility](HCC)    "multiple" (01/09/2018)    Patient Active Problem List   Diagnosis Date Noted  . Sialadenitis 01/09/2018  . Bipolar disorder [facility](HCC)     Past Surgical History:  Procedure Laterality Date  . SUBMANDIBULAR GLAND EXCISION Left 01/09/2018  . SUBMANDIBULAR GLAND EXCISION Left 01/09/2018   Procedure: LEFT EXCISION SUBMANDIBULAR GLAND,POSSIBLE INTRA ORAL APPROACH FOR STONE REMOVAL;  Surgeon: Graylin ShiverMarcellino, Amanda J, MD;  Location: [facility] OR;  Service: ENT;  Laterality: Left;     OB History    Gravida  2   Para  1   Term  1   Preterm      AB  1   Living  1     SAB      TAB  1   Ectopic      Multiple      Live Births  1            Home Medications    Prior to Admission medications   Medication Sig Start Date End Date Taking? Authorizing Provider  LITHIUM PO Take by mouth.    [provider]  Lurasidone HCl (LATUDA PO) Take by mouth.    [provider]  niacin 500 MG tablet Take 500 mg by mouth at bedtime.    [provider]    Family History Family History  Problem Relation Age of Onset  . Hypertension Mother   . Hypertension Father   . Hypertension Maternal Grandmother   . Hypertension Maternal Grandfather   . Hypertension Maternal Aunt   . Alcohol abuse Maternal Aunt     Social History Social History   Tobacco Use  . Smoking status: Current Every Day Smoker    Packs/day: 0.33    Years: 8.00    Pack years: 2.64    Types: Cigarettes  . Smokeless tobacco: Never Used  Substance Use Topics  . Alcohol use: Yes    Alcohol/week: 8.4 oz    Types: 14 Cans of beer per week    Comment: 01/09/2018 "2 beers/day"  .  Drug use: No     Allergies   Patient has no known allergies.   Review of Systems Review of Systems  All other systems reviewed and are negative.    Physical Exam Updated Vital Signs BP 125/81 (BP Location: Right Arm)   Pulse 70   Temp 98.5 F (36.9 C) (Oral)   Resp 16   LMP 02/12/2018   SpO2 100%   Physical Exam  Constitutional: She is oriented to person, place, and time. She appears well-developed and well-nourished.  HENT:  Head: Normocephalic and atraumatic.  Eyes: Pupils are equal, round, and reactive to light. Conjunctivae are normal. Right eye exhibits no discharge. Left eye exhibits no discharge. No scleral icterus.  Neck: Normal range of motion. No JVD present. No tracheal deviation present.  Pulmonary/Chest: Effort normal. No stridor.  Neurological: She is alert and oriented to person, place, and time. She is not disoriented. No cranial nerve deficit or sensory deficit. Coordination normal. GCS  eye subscore is 4. GCS verbal subscore is 5. GCS motor subscore is 6.  Psychiatric: She has a normal mood and affect. Her behavior is normal. Judgment and thought content normal.  Nursing note and vitals reviewed.    ED Treatments / Results  Labs (all labs ordered are listed, but only abnormal results are displayed) Labs Reviewed - No data to display  EKG None  Radiology No results found.  Procedures Procedures (including critical care time)  Medications Ordered in ED Medications  diphenhydrAMINE (BENADRYL) capsule 25 mg (25 mg Oral Not Given 03/20/18 1423)  ketorolac (TORADOL) 30 MG/ML injection 30 mg (30 mg Intramuscular Given 03/20/18 1421)  metoCLOPramide (REGLAN) tablet 5 mg (5 mg Oral Given 03/20/18 1421)     Initial Impression / Assessment and Plan / ED Course  I have reviewed the triage vital signs and the nursing notes.  Pertinent labs & imaging results that were available during my care of the patient were reviewed by me and considered in my medical decision making (see chart for details).     27 year old female presents today with uncomplicated migraine.  Symptoms improved here.  No red flags.  Discharged with symptomatic care strict return precautions.  Patient verbalized understanding and agreement to today's plan.  Final Clinical Impressions(s) / ED Diagnoses   Final diagnoses:  Migraine without status migrainosus, not intractable, unspecified migraine type    ED Discharge Orders    None       Rosalio Loud 03/20/18 1619    Raeford Razor, MD 03/21/18 1252

## 2018-03-20 NOTE — ED Triage Notes (Signed)
PT reports migraine for 6 days and vomiting 5 times. States been up whole night. Took ibuprofen but now out. Then took hydrocodone from previous surgery- states just put her to sleep. Reports blurry vision. Hx of migraines

## 2018-03-20 NOTE — ED Notes (Signed)
Pt verbalizes understanding of d/c instructions. Pt ambulatory at d/c with all belongings and with family.   

## 2018-03-20 NOTE — Discharge Instructions (Addendum)
Please read attached information. If you experience any new or worsening signs or symptoms please return to the emergency room for evaluation. Please follow-up with your primary care provider or specialist as discussed.  °

## 2018-04-21 ENCOUNTER — Encounter (HOSPITAL_COMMUNITY): Payer: Self-pay

## 2018-04-21 ENCOUNTER — Emergency Department (HOSPITAL_COMMUNITY)
Admission: EM | Admit: 2018-04-21 | Discharge: 2018-04-21 | Disposition: A | Discharge status: Home / Self Care | Payer: Medicaid Other | Attending: Emergency Medicine | Admitting: Emergency Medicine

## 2018-04-21 ENCOUNTER — Emergency Department (HOSPITAL_COMMUNITY): Payer: Medicaid Other

## 2018-04-21 DIAGNOSIS — M79601 Pain in right arm: Secondary | ICD-10-CM | POA: Insufficient documentation

## 2018-04-21 DIAGNOSIS — M542 Cervicalgia: Secondary | ICD-10-CM | POA: Insufficient documentation

## 2018-04-21 DIAGNOSIS — M25511 Pain in right shoulder: Secondary | ICD-10-CM | POA: Insufficient documentation

## 2018-04-21 DIAGNOSIS — F17210 Nicotine dependence, cigarettes, uncomplicated: Secondary | ICD-10-CM | POA: Diagnosis not present

## 2018-04-21 DIAGNOSIS — J45909 Unspecified asthma, uncomplicated: Secondary | ICD-10-CM | POA: Insufficient documentation

## 2018-04-21 DIAGNOSIS — Z79899 Other long term (current) drug therapy: Secondary | ICD-10-CM | POA: Insufficient documentation

## 2018-04-21 LAB — POC URINE PREG, ED: PREG TEST UR: NEGATIVE

## 2018-04-21 MED ORDER — KETOROLAC TROMETHAMINE 30 MG/ML IJ SOLN
15.0000 mg | Freq: Once | INTRAMUSCULAR | Status: AC
  Administered 2018-04-21: 15 mg via INTRAMUSCULAR
  Filled 2018-04-21: qty 1

## 2018-04-21 MED ORDER — METHOCARBAMOL 750 MG PO TABS: 750.0000 mg | ORAL_TABLET | Freq: Three times a day (TID) | ORAL | 0 refills | Status: DC | PRN

## 2018-04-21 NOTE — ED Notes (Signed)
Declined W/C at D/C and was escorted to lobby by RN. 

## 2018-04-21 NOTE — ED Notes (Signed)
Patient transported to CT 
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
## 2018-04-21 NOTE — Discharge Instructions (Signed)
Please take Ibuprofen (Advil, motrin) and Tylenol (acetaminophen) to relieve your pain.  You may take up to 600 MG (3 pills) of normal strength ibuprofen every 8 hours as needed.  In between doses of ibuprofen you make take tylenol, up to 1,000 mg (two extra strength pills).  Do not take more than 3,000 mg tylenol in a 24 hour period.  Please check all medication labels as many medications such as pain and cold medications may contain tylenol.  Do not drink alcohol while taking these medications.  Do not take other NSAID'S while taking ibuprofen (such as aleve or naproxen).  Please take ibuprofen with food to decrease stomach upset.  You are being prescribed a medication which may make you sleepy. For 24 hours after one dose please do not drive, operate heavy machinery, care for a small child with out another adult present, or perform any activities that may cause harm to you or someone else if you were to fall asleep or be impaired.   The best way to get rid of muscle pain is by taking NSAIDS, using heat, massage therapy, and gentle stretching/range of motion exercises.  

## 2018-04-21 NOTE — ED Triage Notes (Signed)
PT updated on results of neck CT.

## 2018-08-07 ENCOUNTER — Ambulatory Visit (HOSPITAL_COMMUNITY)
Admission: EM | Admit: 2018-08-07 | Discharge: 2018-08-07 | Disposition: A | Discharge status: Home / Self Care | Payer: Medicaid Other | Attending: Family Medicine | Admitting: Family Medicine

## 2018-08-07 ENCOUNTER — Encounter (HOSPITAL_COMMUNITY): Payer: Self-pay | Admitting: Emergency Medicine

## 2018-08-07 ENCOUNTER — Other Ambulatory Visit: Payer: Self-pay

## 2018-08-07 DIAGNOSIS — R197 Diarrhea, unspecified: Secondary | ICD-10-CM | POA: Diagnosis not present

## 2018-08-07 LAB — POCT URINALYSIS DIP (DEVICE)
Bilirubin Urine: NEGATIVE
Glucose, UA: NEGATIVE mg/dL
HGB URINE DIPSTICK: NEGATIVE
Ketones, ur: NEGATIVE mg/dL
Leukocytes, UA: NEGATIVE
NITRITE: NEGATIVE
PH: 7.5 (ref 5.0–8.0)
Protein, ur: NEGATIVE mg/dL
SPECIFIC GRAVITY, URINE: 1.02 (ref 1.005–1.030)
UROBILINOGEN UA: 0.2 mg/dL (ref 0.0–1.0)

## 2018-08-07 LAB — POCT PREGNANCY, URINE: Preg Test, Ur: NEGATIVE

## 2018-08-07 MED ORDER — DICYCLOMINE HCL 20 MG PO TABS: 20.0000 mg | ORAL_TABLET | Freq: Two times a day (BID) | ORAL | 0 refills | Status: DC

## 2018-08-07 NOTE — ED Provider Notes (Signed)
[facility]    CSN: [id number] Arrival date & time: 08/07/18  4098     History   Chief Complaint Chief Complaint  Patient presents with  . Diarrhea    HPI Erin Hernandez is a 27 y.o. female.   27 year old female comes in for 4 to 5-day history of diarrhea.  States coworkers have similar symptoms, and feels that she caught something.  States when symptoms first started, had mild nausea without vomiting, this has resolved.  She had numerous loose and watery stools, and now has slowed down to about 4 episodes a day.  Denies melena, hematochezia.  Denies fever, chills, night sweats.  Denies other URI symptoms such as cough, congestion, sore throat.  States no obvious abdominal pain, just have "feeling of needing to use the bathroom".  Took Pepto-Bismol with some relief.  Denies recent travel, antibiotic use.     Past Medical History:  Diagnosis Date  . Anxiety   . Asthma   . Bipolar disorder (HCC)   . Depression   . Dyspnea    With exertion  . GERD (gastroesophageal reflux disease)    ocassional - takes tumbs if needed.  . Head injury with loss of consciousness (HCC) 2011  . Migraine    "a few/wk" (01/09/2018)  . Schizophrenia (HCC)   . Suicide attempt [facility])    "multiple" (01/09/2018)    Patient Active Problem List   Diagnosis Date Noted  . Sialadenitis 01/09/2018  . Bipolar disorder [facility])     Past Surgical History:  Procedure Laterality Date  . SUBMANDIBULAR GLAND EXCISION Left 01/09/2018  . SUBMANDIBULAR GLAND EXCISION Left 01/09/2018   Procedure: LEFT EXCISION SUBMANDIBULAR GLAND,POSSIBLE INTRA ORAL APPROACH FOR STONE REMOVAL;  Surgeon: Graylin Shiver, MD;  Location: [facility] OR;  Service: ENT;  Laterality: Left;    OB History    Gravida  2   Para  1   Term  1   Preterm      AB  1   Living  1     SAB      TAB  1   Ectopic      Multiple      Live Births  1            Home Medications    Prior to Admission medications     Medication Sig Start Date End Date Taking? Authorizing Provider  dicyclomine (BENTYL) 20 MG tablet Take 1 tablet (20 mg total) by mouth 2 (two) times daily. 08/07/18   Brevon Dewald V, PA-C  LITHIUM PO Take by mouth.    [provider]  Lurasidone HCl (LATUDA PO) Take by mouth.    [provider]  niacin 500 MG tablet Take 500 mg by mouth at bedtime.    [provider]    Family History Family History  Problem Relation Age of Onset  . Hypertension Mother   . Hypertension Father   . Hypertension Maternal Grandmother   . Hypertension Maternal Grandfather   . Hypertension Maternal Aunt   . Alcohol abuse Maternal Aunt     Social History Social History   Tobacco Use  . Smoking status: Current Every Day Smoker    Packs/day: 0.33    Years: 8.00    Pack years: 2.64    Types: Cigarettes  . Smokeless tobacco: Never Used  Substance Use Topics  . Alcohol use: Yes    Alcohol/week: 14.0 standard drinks    Types: 14 Cans of beer per week  Comment: 01/09/2018 "2 beers/day"  . Drug use: No     Allergies   Patient has no known allergies.   Review of Systems Review of Systems  Reason unable to perform ROS: See HPI as above.     Physical Exam Triage Vital Signs ED Triage Vitals  Enc Vitals Group     BP 08/07/18 0934 (!) 143/84     Pulse Rate 08/07/18 0934 88     Resp 08/07/18 0934 16     Temp 08/07/18 0934 (!) 97.2 F (36.2 C)     Temp Source 08/07/18 0934 Oral     SpO2 08/07/18 0934 96 %     Weight --      Height --      Head Circumference --      Peak Flow --      Pain Score 08/07/18 0939 5     Pain Loc --      Pain Edu? --      Excl. in GC? --    No data found.  Updated Vital Signs BP (!) 143/84 (BP Location: Left Arm)   Pulse 88   Temp (!) 97.2 F (36.2 C) (Oral)   Resp 16   LMP 08/02/2018   SpO2 96%   Physical Exam  Constitutional: She is oriented to person, place, and time. She appears well-developed and well-nourished. No  distress.  HENT:  Head: Normocephalic and atraumatic.  Eyes: Pupils are equal, round, and reactive to light. Conjunctivae are normal.  Cardiovascular: Normal rate, regular rhythm and normal heart sounds. Exam reveals no gallop and no friction rub.  No murmur heard. Pulmonary/Chest: Effort normal and breath sounds normal. No stridor. No respiratory distress. She has no wheezes. She has no rales.  Abdominal: Soft. Bowel sounds are normal. She exhibits no mass. There is no tenderness. There is no rebound, no guarding and no CVA tenderness.  Neurological: She is alert and oriented to person, place, and time.  Skin: Skin is warm and dry.  Psychiatric: She has a normal mood and affect. Her behavior is normal. Judgment normal.   UC Treatments / Results  Labs (all labs ordered are listed, but only abnormal results are displayed) Labs Reviewed  POCT URINALYSIS DIP (DEVICE)  POCT PREGNANCY, URINE    EKG None  Radiology No results found.  Procedures Procedures (including critical care time)  Medications Ordered in UC Medications - No data to display  Initial Impression / Assessment and Plan / UC Course  I have reviewed the triage vital signs and the nursing notes.  Pertinent labs & imaging results that were available during my care of the patient were reviewed by me and considered in my medical decision making (see chart for details).    Bentyl as directed. Discussed imodium use to decrease episodes of diarrhea.  Push fluids. Bland diet, advance as tolerated. Return precautions given.  Final Clinical Impressions(s) / UC Diagnoses   Final diagnoses:  Diarrhea, unspecified type    ED Prescriptions    Medication Sig Dispense Auth. Provider   dicyclomine (BENTYL) 20 MG tablet Take 1 tablet (20 mg total) by mouth 2 (two) times daily. 20 tablet Threasa AlphaYu, Nikki Rusnak V, PA-C        Edy Belt V, PA-C 08/07/18 1027

## 2018-08-07 NOTE — Discharge Instructions (Signed)
Bentyl as directed. Keep hydrated, you urine should be clear to pale yellow in color. Bland diet, advance as tolerated. Monitor for any worsening of symptoms, vomiting, worsening abdominal pain, fever, follow-up for reevaluation.  If diarrhea is not slowing down, you can get over the counter imodium, and take 2mg  one dose.

## 2018-08-07 NOTE — ED Triage Notes (Signed)
Patient has had diarrhea since Friday 08/03/18.  Patient has had 4 episodes of diarrhea today.  Poor appetite.  Denies vomiting.

## 2018-10-10 ENCOUNTER — Ambulatory Visit (HOSPITAL_COMMUNITY): Payer: Medicaid Other

## 2018-10-10 ENCOUNTER — Ambulatory Visit (INDEPENDENT_AMBULATORY_CARE_PROVIDER_SITE_OTHER): Payer: Medicaid Other

## 2018-10-10 ENCOUNTER — Encounter (HOSPITAL_COMMUNITY): Payer: Self-pay | Admitting: Emergency Medicine

## 2018-10-10 ENCOUNTER — Ambulatory Visit (HOSPITAL_COMMUNITY)
Admission: EM | Admit: 2018-10-10 | Discharge: 2018-10-10 | Disposition: A | Discharge status: Home / Self Care | Payer: Medicaid Other | Attending: Family Medicine | Admitting: Family Medicine

## 2018-10-10 DIAGNOSIS — M79641 Pain in right hand: Secondary | ICD-10-CM

## 2018-10-10 DIAGNOSIS — M898X1 Other specified disorders of bone, shoulder: Secondary | ICD-10-CM

## 2018-10-10 DIAGNOSIS — M25511 Pain in right shoulder: Secondary | ICD-10-CM

## 2018-10-10 MED ORDER — DICLOFENAC SODIUM 75 MG PO TBEC: 75.0000 mg | DELAYED_RELEASE_TABLET | Freq: Two times a day (BID) | ORAL | 0 refills | Status: DC

## 2018-10-10 MED ORDER — HYDROCODONE-ACETAMINOPHEN 5-325 MG PO TABS: 1.0000 | ORAL_TABLET | Freq: Four times a day (QID) | ORAL | 0 refills | Status: DC | PRN
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
## 2018-10-10 NOTE — Discharge Instructions (Addendum)
Be aware, pain medications may cause drowsiness. Please do not drive, operate heavy machinery or make important decisions while on this medication, it can cloud your judgement.  

## 2019-08-27 ENCOUNTER — Encounter (HOSPITAL_COMMUNITY): Payer: Self-pay

## 2019-08-27 ENCOUNTER — Other Ambulatory Visit: Payer: Self-pay

## 2019-08-27 ENCOUNTER — Ambulatory Visit (HOSPITAL_COMMUNITY)
Admission: EM | Admit: 2019-08-27 | Discharge: 2019-08-27 | Disposition: A | Discharge status: Home / Self Care | Payer: Medicaid Other | Attending: Emergency Medicine | Admitting: Emergency Medicine

## 2019-08-27 DIAGNOSIS — F17210 Nicotine dependence, cigarettes, uncomplicated: Secondary | ICD-10-CM

## 2019-08-27 DIAGNOSIS — Z20828 Contact with and (suspected) exposure to other viral communicable diseases: Secondary | ICD-10-CM

## 2019-08-27 DIAGNOSIS — J029 Acute pharyngitis, unspecified: Secondary | ICD-10-CM | POA: Diagnosis not present

## 2019-08-27 DIAGNOSIS — Z20822 Contact with and (suspected) exposure to covid-19: Secondary | ICD-10-CM

## 2019-08-27 LAB — POCT RAPID STREP A: Streptococcus, Group A Screen (Direct): NEGATIVE

## 2019-08-27 MED ORDER — LIDOCAINE VISCOUS HCL 2 % MT SOLN: 15.0000 mL | OROMUCOSAL | 0 refills | Status: DC | PRN

## 2019-08-27 MED ORDER — CETIRIZINE-PSEUDOEPHEDRINE ER 5-120 MG PO TB12: 1.0000 | ORAL_TABLET | Freq: Every day | ORAL | 0 refills | Status: DC

## 2019-08-27 NOTE — ED Provider Notes (Signed)
[facility]    CSN: [id number] Arrival date & time: 08/27/19  1038      History   Chief Complaint Chief Complaint  Patient presents with  . Sore Throat    HPI Erin Hernandez is a 28 y.o. female.   The history is provided by the patient. No language interpreter was used.  Sore Throat This is a new problem. The current episode started 2 days ago. The problem occurs constantly. The problem has not changed since onset.Pertinent negatives include no chest pain, no abdominal pain, no headaches and no shortness of breath. The symptoms are aggravated by swallowing and drinking. Nothing relieves the symptoms. She has tried acetaminophen for the symptoms. The treatment provided no relief.    Past Medical History:  Diagnosis Date  . Anxiety   . Asthma   . Bipolar disorder (HCC)   . Depression   . Dyspnea    With exertion  . GERD (gastroesophageal reflux disease)    ocassional - takes tumbs if needed.  . Head injury with loss of consciousness (HCC) 2011  . Migraine    "a few/wk" (01/09/2018)  . Schizophrenia (HCC)   . Suicide attempt [facility])    "multiple" (01/09/2018)    Patient Active Problem List   Diagnosis Date Noted  . Sialadenitis 01/09/2018  . Bipolar disorder [facility])     Past Surgical History:  Procedure Laterality Date  . SUBMANDIBULAR GLAND EXCISION Left 01/09/2018  . SUBMANDIBULAR GLAND EXCISION Left 01/09/2018   Procedure: LEFT EXCISION SUBMANDIBULAR GLAND,POSSIBLE INTRA ORAL APPROACH FOR STONE REMOVAL;  Surgeon: Graylin Shiver, MD;  Location: [facility] OR;  Service: ENT;  Laterality: Left;    OB History    Gravida  2   Para  1   Term  1   Preterm      AB  1   Living  1     SAB      TAB  1   Ectopic      Multiple      Live Births  1            Home Medications    Prior to Admission medications   Medication Sig Start Date End Date Taking? Authorizing Provider  cetirizine-pseudoephedrine (ZYRTEC-D) 5-120 MG tablet Take 1 tablet  by mouth daily. 08/27/19   Jennafer Gladue, Zachery Dakins, FNP  diclofenac (VOLTAREN) 75 MG EC tablet Take 1 tablet (75 mg total) by mouth 2 (two) times daily. 10/10/18   Mardella Layman, MD  dicyclomine (BENTYL) 20 MG tablet Take 1 tablet (20 mg total) by mouth 2 (two) times daily. 08/07/18   Cathie Hoops, Amy V, PA-C  HYDROcodone-acetaminophen (NORCO/VICODIN) 5-325 MG tablet Take 1 tablet by mouth every 6 (six) hours as needed for moderate pain or severe pain. 10/10/18   Mardella Layman, MD  lidocaine (XYLOCAINE) 2 % solution Use as directed 15 mLs in the mouth or throat as needed for mouth pain. 08/27/19   Jahna Liebert, Zachery Dakins, FNP  LITHIUM PO Take by mouth.    [provider]  Lurasidone HCl (LATUDA PO) Take by mouth.    [provider]  niacin 500 MG tablet Take 500 mg by mouth at bedtime.    [provider]    Family History Family History  Problem Relation Age of Onset  . Hypertension Mother   . Hypertension Father   . Hypertension Maternal Grandmother   . Hypertension Maternal Grandfather   . Hypertension Maternal Aunt   . Alcohol abuse Maternal  Aunt     Social History Social History   Tobacco Use  . Smoking status: Current Every Day Smoker    Packs/day: 0.33    Years: 8.00    Pack years: 2.64    Types: Cigarettes  . Smokeless tobacco: Never Used  Substance Use Topics  . Alcohol use: Yes    Alcohol/week: 14.0 standard drinks    Types: 14 Cans of beer per week    Comment: 01/09/2018 "2 beers/day"  . Drug use: No     Allergies   Patient has no known allergies.   Review of Systems Review of Systems  Constitutional: Negative.  Negative for activity change, appetite change, chills, fatigue and fever.  HENT: Positive for sore throat. Negative for congestion, dental problem, ear pain, sinus pressure and sinus pain.   Respiratory: Negative.  Negative for cough and shortness of breath.   Cardiovascular: Negative for chest pain.  Gastrointestinal: Negative for abdominal  pain, diarrhea, nausea and vomiting.  Neurological: Negative for headaches.  ROS: All other are negatives  Physical Exam Triage Vital Signs ED Triage Vitals  Enc Vitals Group     BP 08/27/19 1108 133/89     Pulse Rate 08/27/19 1108 74     Resp 08/27/19 1108 19     Temp 08/27/19 1108 98.4 F (36.9 C)     Temp Source 08/27/19 1108 Oral     SpO2 08/27/19 1108 97 %     Weight 08/27/19 1105 260 lb (117.9 kg)     Height --      Head Circumference --      Peak Flow --      Pain Score 08/27/19 1104 5     Pain Loc --      Pain Edu? --      Excl. in GC? --    No data found.  Updated Vital Signs BP 133/89 (BP Location: Right Arm)   Pulse 74   Temp 98.4 F (36.9 C) (Oral)   Resp 19   Wt 260 lb (117.9 kg)   LMP 08/25/2019 (Exact Date)   SpO2 97%   BMI 43.27 kg/m   Visual Acuity Right Eye Distance:   Left Eye Distance:   Bilateral Distance:    Right Eye Near:   Left Eye Near:    Bilateral Near:     Physical Exam Vitals signs and nursing note reviewed.  Constitutional:      General: She is not in acute distress.    Appearance: She is well-developed and normal weight. She is not ill-appearing or toxic-appearing.  HENT:     Head: Normocephalic.     Right Ear: Tympanic membrane and ear canal normal. No drainage, swelling or tenderness.     Left Ear: Tympanic membrane and ear canal normal. No drainage, swelling or tenderness.     Nose: No congestion.     Mouth/Throat:     Mouth: Mucous membranes are moist. No oral lesions.     Pharynx: No pharyngeal swelling, oropharyngeal exudate, posterior oropharyngeal erythema or uvula swelling.     Tonsils: No tonsillar exudate or tonsillar abscesses. 2+ on the right. 2+ on the left.  Cardiovascular:     Rate and Rhythm: Normal rate and regular rhythm.     Pulses: Normal pulses.     Heart sounds: Normal heart sounds. No murmur.  Pulmonary:     Effort: Pulmonary effort is normal. No respiratory distress.     Breath sounds: Normal  breath sounds.  Chest:  Chest wall: No tenderness.  Abdominal:     General: Bowel sounds are normal. There is no distension.     Palpations: Abdomen is soft. There is no mass.     Tenderness: There is no abdominal tenderness. There is no guarding.  Skin:    General: Skin is warm.  Neurological:     Mental Status: She is alert and oriented to person, place, and time.      UC Treatments / Results  Labs (all labs ordered are listed, but only abnormal results are displayed) Labs Reviewed  NOVEL CORONAVIRUS, NAA (HOSP ORDER, SEND-OUT TO REF LAB; TAT 18-24 HRS)  CULTURE, GROUP A STREP [facility])  POCT RAPID STREP A    EKG   Radiology No results found.  Procedures Procedures (including critical care time)  Medications Ordered in UC Medications - No data to display  Initial Impression / Assessment and Plan / UC Course  I have reviewed the triage vital signs and the nursing notes.  Pertinent labs & imaging results that were available during my care of the patient were reviewed by me and considered in my medical decision making (see chart for details).    Patient hx, symptoms and physical exam consistent with viral pharyngitis.  Strep test was negative.  Culture sent.  Patient will continue symptomatic treatment.  He will follow up with PCP is symptoms persists.  Return precautions given.    Final Clinical Impressions(s) / UC Diagnoses   Final diagnoses:  Viral pharyngitis  Suspected COVID-19 virus infection     Discharge Instructions     COVID testing ordered.  It will take between 2-7 days for test results.  Someone will contact you regarding abnormal results.    In the meantime: You should remain isolated in your home for 10 days from symptom onset AND greater than 72 hours after symptoms resolution (absence of fever without the use of fever-reducing medication and improvement in respiratory symptoms), whichever is longer   Strep test negative, will send out for  culture and we will call you with results Get plenty of rest and push fluids Zyrtec D prescribed. Use daily for symptomatic relief Viscous lidocaine prescribed.  This is an oral solution you can swish, and gargle as needed for symptomatic relief of sore throat.  Do not exceed 8 doses in a 24 hour period.  Do not use prior to eating, as this will numb your entire mouth.   Drink warm or cool liquids, use throat lozenges, or popsicles to help alleviate symptoms Take OTC ibuprofen or tylenol as needed for pain Follow up with PCP if symptoms persists Return or go to ER if patient has any new or worsening symptoms such as fever, chills, nausea, vomiting, worsening sore throat, cough, abdominal pain, chest pain, changes in bowel or bladder habits, etc...     ED Prescriptions    Medication Sig Dispense Auth. Provider   cetirizine-pseudoephedrine (ZYRTEC-D) 5-120 MG tablet Take 1 tablet by mouth daily. 30 tablet Kenley Troop S, FNP   lidocaine (XYLOCAINE) 2 % solution Use as directed 15 mLs in the mouth or throat as needed for mouth pain. 100 mL Emerson Monte, FNP     PDMP not reviewed this encounter.   Emerson Monte, FNP 08/27/19 1200

## 2019-08-27 NOTE — ED Triage Notes (Signed)
Pt. States she woke up Sunday with a sore throat, states its only sore on the right side.

## 2019-08-27 NOTE — Discharge Instructions (Signed)
COVID testing ordered.  It will take between 2-7 days for test results.  Someone will contact you regarding abnormal results.    In the meantime: You should remain isolated in your home for 10 days from symptom onset AND greater than 72 hours after symptoms resolution (absence of fever without the use of fever-reducing medication and improvement in respiratory symptoms), whichever is longer   Strep test negative, will send out for culture and we will call you with results Get plenty of rest and push fluids Zyrtec D prescribed. Use daily for symptomatic relief Viscous lidocaine prescribed.  This is an oral solution you can swish, and gargle as needed for symptomatic relief of sore throat.  Do not exceed 8 doses in a 24 hour period.  Do not use prior to eating, as this will numb your entire mouth.   Drink warm or cool liquids, use throat lozenges, or popsicles to help alleviate symptoms Take OTC ibuprofen or tylenol as needed for pain Follow up with PCP if symptoms persists Return or go to ER if patient has any new or worsening symptoms such as fever, chills, nausea, vomiting, worsening sore throat, cough, abdominal pain, chest pain, changes in bowel or bladder habits, etc..Marland Kitchen

## 2019-08-28 LAB — CULTURE, GROUP A STREP (THRC)

## 2019-08-29 ENCOUNTER — Telehealth: Payer: Self-pay | Admitting: Emergency Medicine

## 2019-08-29 LAB — NOVEL CORONAVIRUS, NAA (HOSP ORDER, SEND-OUT TO REF LAB; TAT 18-24 HRS): SARS-CoV-2, NAA: NOT DETECTED

## 2019-08-29 MED ORDER — PENICILLIN V POTASSIUM 500 MG PO TABS: 500.0000 mg | ORAL_TABLET | Freq: Two times a day (BID) | ORAL | 0 refills | Status: AC

## 2019-08-29 NOTE — Telephone Encounter (Signed)
Culture is positive for non group A Strep germ.  This is a finding of uncertain significance; not the typical 'strep throat' germ.  Pt complains of persistent symptoms.  Rx for penicillin V 500mg  bid x 10d #20 no refills sent to pharmacy of patients choice. Pt verbalized understanding. Recheck for further evaluation if symptoms are not improving  Patient contacted by phone and made aware of    results. Pt verbalized understanding and had all questions answered.

## 2019-11-05 ENCOUNTER — Encounter (HOSPITAL_COMMUNITY): Payer: Self-pay

## 2019-11-05 ENCOUNTER — Other Ambulatory Visit: Payer: Self-pay

## 2019-11-05 ENCOUNTER — Ambulatory Visit (HOSPITAL_COMMUNITY)
Admission: EM | Admit: 2019-11-05 | Discharge: 2019-11-05 | Disposition: A | Discharge status: Home / Self Care | Payer: Medicaid Other | Attending: Urgent Care | Admitting: Urgent Care

## 2019-11-05 DIAGNOSIS — Z20822 Contact with and (suspected) exposure to covid-19: Secondary | ICD-10-CM | POA: Diagnosis present

## 2019-11-05 NOTE — ED Provider Notes (Signed)
[facility]   MRN: [id number] DOB: Jan 13, 1991  Subjective:   Erin Hernandez is a 29 y.o. female presenting for COVID-19 testing.  Patient had exposure over the weekend.  No current facility-administered medications for this encounter.  Current Outpatient Medications:  .  cetirizine-pseudoephedrine (ZYRTEC-D) 5-120 MG tablet, Take 1 tablet by mouth daily., Disp: 30 tablet, Rfl: 0 .  diclofenac (VOLTAREN) 75 MG EC tablet, Take 1 tablet (75 mg total) by mouth 2 (two) times daily., Disp: 14 tablet, Rfl: 0 .  dicyclomine (BENTYL) 20 MG tablet, Take 1 tablet (20 mg total) by mouth 2 (two) times daily., Disp: 20 tablet, Rfl: 0 .  HYDROcodone-acetaminophen (NORCO/VICODIN) 5-325 MG tablet, Take 1 tablet by mouth every 6 (six) hours as needed for moderate pain or severe pain., Disp: 8 tablet, Rfl: 0 .  lidocaine (XYLOCAINE) 2 % solution, Use as directed 15 mLs in the mouth or throat as needed for mouth pain., Disp: 100 mL, Rfl: 0 .  LITHIUM PO, Take by mouth., Disp: , Rfl:  .  Lurasidone HCl (LATUDA PO), Take by mouth., Disp: , Rfl:  .  niacin 500 MG tablet, Take 500 mg by mouth at bedtime., Disp: , Rfl:    No Known Allergies  Past Medical History:  Diagnosis Date  . Anxiety   . Asthma   . Bipolar disorder ([place])   . Depression   . Dyspnea    With exertion  . GERD (gastroesophageal reflux disease)    ocassional - takes tumbs if needed.  . Head injury with loss of consciousness ([place]) 2011  . Migraine    "a few/wk" (01/09/2018)  . Schizophrenia ([employer])   . Suicide attempt [facility])    "multiple" (01/09/2018)     Past Surgical History:  Procedure Laterality Date  . SUBMANDIBULAR GLAND EXCISION Left 01/09/2018  . SUBMANDIBULAR GLAND EXCISION Left 01/09/2018   Procedure: LEFT EXCISION SUBMANDIBULAR GLAND,POSSIBLE INTRA ORAL APPROACH FOR STONE REMOVAL;  Surgeon: Helayne Seminole, MD;  Location: [facility];  Service: ENT;  Laterality: Left;    Family History  Problem Relation Age of  Onset  . Hypertension Mother   . Hypertension Father   . Hypertension Maternal Grandmother   . Hypertension Maternal Grandfather   . Hypertension Maternal Aunt   . Alcohol abuse Maternal Aunt     Social History   Tobacco Use  . Smoking status: Current Every Day Smoker    Packs/day: 0.33    Years: 8.00    Pack years: 2.64    Types: Cigarettes  . Smokeless tobacco: Never Used  Substance Use Topics  . Alcohol use: Yes    Alcohol/week: 14.0 standard drinks    Types: 14 Cans of beer per week    Comment: 01/09/2018 "2 beers/day"  . Drug use: No    Review of Systems  Constitutional: Negative for fever and malaise/fatigue.  HENT: Negative for congestion, ear pain, sinus pain and sore throat.   Eyes: Negative for blurred vision, double vision, discharge and redness.  Respiratory: Negative for cough, hemoptysis, shortness of breath and wheezing.   Cardiovascular: Negative for chest pain.  Gastrointestinal: Negative for abdominal pain, diarrhea, nausea and vomiting.  Genitourinary: Negative for dysuria, flank pain and hematuria.  Musculoskeletal: Negative for myalgias.  Skin: Negative for rash.  Neurological: Negative for dizziness, weakness and headaches.  Psychiatric/Behavioral: Negative for depression and substance abuse.     Objective:   Vitals: BP (!) 142/88 (BP Location: Left Arm)   Pulse 82   Temp  98.7 F (37.1 C) (Oral)   Resp 16   SpO2 98%   Physical Exam Constitutional:      General: She is not in acute distress.    Appearance: Normal appearance. She is well-developed. She is not ill-appearing.  HENT:     Head: Normocephalic and atraumatic.     Nose: Nose normal.     Mouth/Throat:     Mouth: Mucous membranes are moist.     Pharynx: Oropharynx is clear.  Eyes:     General: No scleral icterus.    Extraocular Movements: Extraocular movements intact.     Pupils: Pupils are equal, round, and reactive to light.  Cardiovascular:     Rate and Rhythm: Normal  rate.  Pulmonary:     Effort: Pulmonary effort is normal.  Skin:    General: Skin is warm and dry.  Neurological:     General: No focal deficit present.     Mental Status: She is alert and oriented to person, place, and time.  Psychiatric:        Mood and Affect: Mood normal.        Behavior: Behavior normal.     Assessment and Plan :   1. Close exposure to COVID-19 virus    Counseled patient on nature of COVID-19 including modes of transmission, diagnostic testing, management and supportive care.  Counseled on medications used for symptomatic relief. COVID 19 testing is pending. Counseled patient on potential for adverse effects with medications prescribed/recommended today, ER and return-to-clinic precautions discussed, patient verbalized understanding.     Wallis Bamberg, [place] 11/05/19 1849

## 2019-11-05 NOTE — ED Triage Notes (Signed)
Patient presents to Urgent Care with complaints of possible covid exposure since 4 days ago. Patient reports she has no sx.

## 2019-11-05 NOTE — Discharge Instructions (Signed)
We will notify you of your COVID-19 test results as they arrive and may take between 2 to 7 days.  In the meantime, if you develop symptoms including fever, chest pain, shortness of breath despite our current treatment plan then please report to the emergency room as this may be a sign of worsening status from possible COVID-19 infection.   We will manage this as a viral syndrome. For sore throat or cough try using a honey-based tea. Use 3 teaspoons of honey with juice squeezed from half lemon. Place shaved pieces of ginger into 1/2-1 cup of water and warm over stove top. Then mix the ingredients and repeat every 4 hours as needed. Please take Tylenol 500mg  every 6 hours. Hydrate very well with at least 2 liters of water. Eat light meals such as soups to replenish electrolytes and soft fruits, veggies. Start an antihistamine like Zyrtec (cetirizine) at 10mg  daily for postnasal drainage, sinus congestion.  You can take this together with pseudoephedrine (Sudafed) at a dose of 60 mg 3 times a day or twice daily as needed for the same kind of congestion.

## 2019-11-07 LAB — NOVEL CORONAVIRUS, NAA (HOSP ORDER, SEND-OUT TO REF LAB; TAT 18-24 HRS): SARS-CoV-2, NAA: NOT DETECTED

## 2020-06-10 ENCOUNTER — Other Ambulatory Visit: Payer: Self-pay

## 2020-06-10 ENCOUNTER — Ambulatory Visit (HOSPITAL_COMMUNITY)
Admission: EM | Admit: 2020-06-10 | Discharge: 2020-06-10 | Disposition: A | Discharge status: Home / Self Care | Payer: Medicaid Other | Attending: Family Medicine | Admitting: Family Medicine

## 2020-06-10 DIAGNOSIS — Z20822 Contact with and (suspected) exposure to covid-19: Secondary | ICD-10-CM | POA: Insufficient documentation

## 2020-06-10 LAB — SARS CORONAVIRUS 2 (TAT 6-24 HRS): SARS Coronavirus 2: NEGATIVE

## 2020-06-10 NOTE — Discharge Instructions (Signed)

## 2020-06-10 NOTE — ED Triage Notes (Signed)
Pt states she was potentially exposed to COVID positive friend on Sunday/last Thursday. Denies any URI sx, fever, chills, n/v/d.

## 2020-10-08 ENCOUNTER — Encounter (HOSPITAL_COMMUNITY): Payer: Self-pay | Admitting: Emergency Medicine

## 2020-10-08 ENCOUNTER — Other Ambulatory Visit: Payer: Self-pay

## 2020-10-08 ENCOUNTER — Ambulatory Visit (HOSPITAL_COMMUNITY)
Admission: EM | Admit: 2020-10-08 | Discharge: 2020-10-08 | Disposition: A | Discharge status: Home / Self Care | Payer: Medicaid Other | Attending: Urgent Care | Admitting: Urgent Care

## 2020-10-08 DIAGNOSIS — Z20822 Contact with and (suspected) exposure to covid-19: Secondary | ICD-10-CM | POA: Diagnosis present

## 2020-10-08 DIAGNOSIS — R0789 Other chest pain: Secondary | ICD-10-CM | POA: Diagnosis present

## 2020-10-08 DIAGNOSIS — B349 Viral infection, unspecified: Secondary | ICD-10-CM | POA: Diagnosis not present

## 2020-10-08 DIAGNOSIS — R059 Cough, unspecified: Secondary | ICD-10-CM

## 2020-10-08 DIAGNOSIS — U071 COVID-19: Secondary | ICD-10-CM | POA: Insufficient documentation

## 2020-10-08 LAB — SARS CORONAVIRUS 2 (TAT 6-24 HRS): SARS Coronavirus 2: POSITIVE — AB

## 2020-10-08 MED ORDER — PSEUDOEPHEDRINE HCL 60 MG PO TABS: 60.0000 mg | ORAL_TABLET | Freq: Three times a day (TID) | ORAL | 0 refills | Status: DC | PRN

## 2020-10-08 MED ORDER — BENZONATATE 100 MG PO CAPS: 100.0000 mg | ORAL_CAPSULE | Freq: Three times a day (TID) | ORAL | 0 refills | Status: DC | PRN

## 2020-10-08 MED ORDER — CETIRIZINE HCL 10 MG PO TABS: 10.0000 mg | ORAL_TABLET | Freq: Every day | ORAL | 0 refills | Status: DC

## 2020-10-08 MED ORDER — PROMETHAZINE-DM 6.25-15 MG/5ML PO SYRP: 5.0000 mL | ORAL_SOLUTION | Freq: Every evening | ORAL | 0 refills | Status: DC | PRN

## 2020-10-08 NOTE — ED Triage Notes (Signed)
Pt presents with headache, cough, body aches, chills and sore throat xs 3 days. States son tested positive for COVID yesterday.

## 2020-10-08 NOTE — ED Provider Notes (Signed)
[facility]   MRN: [id number] DOB: October 28, 1990  Subjective:   Erin Hernandez is a 30 y.o. female presenting for 3-day history of acute onset body aches, chills, sore throat, cough, sinus headache.  Patient is COVID vaccinated.  Her son tested positive for COVID-19 yesterday.  Denies shortness of breath.  The cough does elicit chest pain.  Has not used medications for relief.  Denies taking chronic medications.    No Known Allergies  Past Medical History:  Diagnosis Date  . Anxiety   . Asthma   . Bipolar disorder (HCC)   . Depression   . Dyspnea    With exertion  . GERD (gastroesophageal reflux disease)    ocassional - takes tumbs if needed.  . Head injury with loss of consciousness (HCC) 2011  . Migraine    "a few/wk" (01/09/2018)  . Schizophrenia (HCC)   . Suicide attempt [facility])    "multiple" (01/09/2018)     Past Surgical History:  Procedure Laterality Date  . SUBMANDIBULAR GLAND EXCISION Left 01/09/2018  . SUBMANDIBULAR GLAND EXCISION Left 01/09/2018   Procedure: LEFT EXCISION SUBMANDIBULAR GLAND,POSSIBLE INTRA ORAL APPROACH FOR STONE REMOVAL;  Surgeon: Graylin Shiver, MD;  Location: [facility] OR;  Service: ENT;  Laterality: Left;    Family History  Problem Relation Age of Onset  . Hypertension Mother   . Hypertension Father   . Hypertension Maternal Grandmother   . Hypertension Maternal Grandfather   . Hypertension Maternal Aunt   . Alcohol abuse Maternal Aunt     Social History   Tobacco Use  . Smoking status: Current Every Day Smoker    Packs/day: 0.33    Years: 8.00    Pack years: 2.64    Types: Cigarettes  . Smokeless tobacco: Never Used  Vaping Use  . Vaping Use: Never used  Substance Use Topics  . Alcohol use: Yes    Alcohol/week: 14.0 standard drinks    Types: 14 Cans of beer per week    Comment: 01/09/2018 "2 beers/day"  . Drug use: No    ROS   Objective:   Vitals: BP 125/89 (BP Location: Right Arm)   Pulse 87   Temp  99.3 F (37.4 C) (Oral)   Resp 18   LMP 09/30/2020   SpO2 98%   Physical Exam Constitutional:      General: She is not in acute distress.    Appearance: Normal appearance. She is well-developed. She is not ill-appearing, toxic-appearing or diaphoretic.  HENT:     Head: Normocephalic and atraumatic.     Nose: Nose normal.     Mouth/Throat:     Mouth: Mucous membranes are moist.     Pharynx: No pharyngeal swelling, oropharyngeal exudate, posterior oropharyngeal erythema or uvula swelling.     Tonsils: No tonsillar exudate or tonsillar abscesses.  Eyes:     Extraocular Movements: Extraocular movements intact.     Pupils: Pupils are equal, round, and reactive to light.  Cardiovascular:     Rate and Rhythm: Normal rate and regular rhythm.     Pulses: Normal pulses.     Heart sounds: Normal heart sounds. No murmur heard. No friction rub. No gallop.   Pulmonary:     Effort: Pulmonary effort is normal. No respiratory distress.     Breath sounds: Normal breath sounds. No stridor. No wheezing, rhonchi or rales.  Skin:    General: Skin is warm and dry.     Findings: No rash.  Neurological:  Mental Status: She is alert and oriented to person, place, and time.  Psychiatric:        Mood and Affect: Mood normal.        Behavior: Behavior normal.        Thought Content: Thought content normal.     Assessment and Plan :   PDMP not reviewed this encounter.  1. Viral syndrome   2. Exposure to confirmed case of COVID-19   3. Cough   4. Atypical chest pain     Will manage for viral illness such as viral URI, viral syndrome, viral rhinitis, COVID-19. Counseled patient on nature of COVID-19 including modes of transmission, diagnostic testing, management and supportive care.  Offered scripts for symptomatic relief. COVID 19 testing is pending. Counseled patient on potential for adverse effects with medications prescribed/recommended today, ER and return-to-clinic precautions discussed,  patient verbalized understanding.     Wallis Bamberg, [place] 10/08/20 [phone]

## 2020-10-08 NOTE — Discharge Instructions (Addendum)

## 2021-04-11 ENCOUNTER — Ambulatory Visit (HOSPITAL_COMMUNITY)
Admission: EM | Admit: 2021-04-11 | Discharge: 2021-04-11 | Discharge status: Against Medical Advice | Payer: Medicaid Other

## 2021-04-11 ENCOUNTER — Other Ambulatory Visit: Payer: Self-pay

## 2021-04-11 NOTE — ED Notes (Signed)
This nurse was not told why this patient left

## 2021-05-31 ENCOUNTER — Ambulatory Visit (HOSPITAL_COMMUNITY)
Admission: EM | Admit: 2021-05-31 | Discharge: 2021-05-31 | Disposition: A | Discharge status: Home / Self Care | Payer: Medicaid Other | Attending: Internal Medicine | Admitting: Internal Medicine

## 2021-05-31 ENCOUNTER — Other Ambulatory Visit: Payer: Self-pay

## 2021-05-31 ENCOUNTER — Ambulatory Visit (INDEPENDENT_AMBULATORY_CARE_PROVIDER_SITE_OTHER): Payer: Medicaid Other

## 2021-05-31 ENCOUNTER — Encounter (HOSPITAL_COMMUNITY): Payer: Self-pay

## 2021-05-31 DIAGNOSIS — M25562 Pain in left knee: Secondary | ICD-10-CM | POA: Diagnosis not present

## 2021-05-31 MED ORDER — KETOROLAC TROMETHAMINE 10 MG PO TABS: 10.0000 mg | ORAL_TABLET | Freq: Four times a day (QID) | ORAL | 0 refills | Status: DC | PRN

## 2021-05-31 NOTE — ED Triage Notes (Signed)
Pt present left knee pain, pt states symptoms started two months ago.
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
## 2021-05-31 NOTE — Discharge Instructions (Addendum)
-  Toradol (Ketorolac), one pill up to every 6 hours for pain. Make sure to take this with food. Avoid other NSAIDs like ibuprofen, alleve, naproxen while taking this medication.  -You can also take Tylenol 1000mg  up to 3x daily -Knee brace while walking and standing -Follow-up with an orthopedist for additional imaging and management. I recommend [facility] at [address], [place], [place]. You can schedule an appointment by calling [phone]) or online [phone]), but they also have a walk-in clinic M-F 8a-8p and Sat 10a-3p.

## 2021-06-01 DIAGNOSIS — S8992XA Unspecified injury of left lower leg, initial encounter: Secondary | ICD-10-CM | POA: Insufficient documentation

## 2022-02-08 ENCOUNTER — Ambulatory Visit (HOSPITAL_COMMUNITY)
Admission: EM | Admit: 2022-02-08 | Discharge: 2022-02-08 | Disposition: A | Discharge status: Home / Self Care | Payer: Medicaid Other | Attending: Family Medicine | Admitting: Family Medicine

## 2022-02-08 DIAGNOSIS — N926 Irregular menstruation, unspecified: Secondary | ICD-10-CM

## 2022-02-08 DIAGNOSIS — R1084 Generalized abdominal pain: Secondary | ICD-10-CM | POA: Diagnosis not present

## 2022-02-08 LAB — CBC WITH DIFFERENTIAL/PLATELET
Abs Immature Granulocytes: 0.03 10*3/uL (ref 0.00–0.07)
Basophils Absolute: 0 10*3/uL (ref 0.0–0.1)
Basophils Relative: 0 %
Eosinophils Absolute: 0 10*3/uL (ref 0.0–0.5)
Eosinophils Relative: 0 %
HCT: 42 % (ref 36.0–46.0)
Hemoglobin: 14 g/dL (ref 12.0–15.0)
Immature Granulocytes: 0 %
Lymphocytes Relative: 12 %
Lymphs Abs: 1.4 10*3/uL (ref 0.7–4.0)
MCH: 28.1 pg (ref 26.0–34.0)
MCHC: 33.3 g/dL (ref 30.0–36.0)
MCV: 84.3 fL (ref 80.0–100.0)
Monocytes Absolute: 0.8 10*3/uL (ref 0.1–1.0)
Monocytes Relative: 7 %
Neutro Abs: 9.1 10*3/uL — ABNORMAL HIGH (ref 1.7–7.7)
Neutrophils Relative %: 81 %
Platelets: 265 10*3/uL (ref 150–400)
RBC: 4.98 MIL/uL (ref 3.87–5.11)
RDW: 17.2 % — ABNORMAL HIGH (ref 11.5–15.5)
WBC: 11.4 10*3/uL — ABNORMAL HIGH (ref 4.0–10.5)
nRBC: 0 % (ref 0.0–0.2)

## 2022-02-08 LAB — POCT URINALYSIS DIPSTICK, ED / UC
Bilirubin Urine: NEGATIVE
Glucose, UA: NEGATIVE mg/dL
Ketones, ur: NEGATIVE mg/dL
Leukocytes,Ua: NEGATIVE
Nitrite: NEGATIVE
Protein, ur: NEGATIVE mg/dL
Specific Gravity, Urine: >=1.03 (ref 1.005–1.030)
Urobilinogen, UA: 0.2 mg/dL (ref 0.0–1.0)
pH: 5.5 (ref 5.0–8.0)

## 2022-02-08 LAB — COMPREHENSIVE METABOLIC PANEL
ALT: 16 U/L (ref 0–44)
AST: 22 U/L (ref 15–41)
Albumin: 3.9 g/dL (ref 3.5–5.0)
Alkaline Phosphatase: 65 U/L (ref 38–126)
Anion gap: 8 (ref 5–15)
BUN: 9 mg/dL (ref 6–20)
CO2: 21 mmol/L — ABNORMAL LOW (ref 22–32)
Calcium: 9.1 mg/dL (ref 8.9–10.3)
Chloride: 106 mmol/L (ref 98–111)
Creatinine, Ser: 0.78 mg/dL (ref 0.44–1.00)
GFR, Estimated: >60 mL/min (ref >60)
Glucose, Bld: 83 mg/dL (ref 70–99)
Potassium: 4.3 mmol/L (ref 3.5–5.1)
Sodium: 135 mmol/L (ref 135–145)
Total Bilirubin: 1 mg/dL (ref 0.3–1.2)
Total Protein: 7.1 g/dL (ref 6.5–8.1)

## 2022-02-08 LAB — LIPASE, BLOOD: Lipase: 20 U/L (ref 11–51)

## 2022-02-08 LAB — POC URINE PREG, ED: Preg Test, Ur: NEGATIVE

## 2022-02-08 LAB — HCG, QUANTITATIVE, PREGNANCY: hCG, Beta Chain, Quant, S: 1 m[IU]/mL (ref <5)

## 2022-02-08 MED ORDER — ONDANSETRON 4 MG PO TBDP
4.0000 mg | ORAL_TABLET | Freq: Once | ORAL | Status: AC
  Administered 2022-02-08: 4 mg via ORAL

## 2022-02-08 MED ORDER — ONDANSETRON 4 MG PO TBDP
ORAL_TABLET | ORAL | Status: AC
  Filled 2022-02-08: qty 1

## 2022-02-08 MED ORDER — ONDANSETRON 4 MG PO TBDP: 4.0000 mg | ORAL_TABLET | Freq: Three times a day (TID) | ORAL | 0 refills | Status: DC | PRN

## 2022-02-08 MED ORDER — PANTOPRAZOLE SODIUM 40 MG PO TBEC: 40.0000 mg | DELAYED_RELEASE_TABLET | Freq: Every day | ORAL | 0 refills | Status: DC

## 2022-02-08 NOTE — ED Triage Notes (Signed)
C/o lower back pain x 1 week. Pt reports having frequent headaches. ?

## 2022-02-08 NOTE — Discharge Instructions (Addendum)
You have been seen today for abdominal pain. Your evaluation was not suggestive of any emergent condition requiring medical intervention at this time. However, some abdominal problems make take more time to appear. Therefore, it is very important for you to pay attention to any new symptoms or worsening of your current condition. ? ?Please return here or to the Emergency Department immediately should you begin to feel worse in any way or have any of the following symptoms: worsening or different abdominal pain, persistent vomiting, inability to drink fluids, fevers, or shaking chills.  ? ?You have had labs (blood work) sent today. We will call you with any significant abnormalities or if there is need to begin or change treatment or pursue further follow up. ? ?You may also review your test results online through MyChart. If you do not have a MyChart account, instructions to sign up should be on your discharge paperwork. ? ?
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
## 2022-08-12 ENCOUNTER — Ambulatory Visit (HOSPITAL_COMMUNITY)
Admission: EM | Admit: 2022-08-12 | Discharge: 2022-08-12 | Disposition: A | Discharge status: Home / Self Care | Payer: Medicaid Other | Attending: Family Medicine | Admitting: Family Medicine

## 2022-08-12 ENCOUNTER — Other Ambulatory Visit: Payer: Self-pay

## 2022-08-12 ENCOUNTER — Encounter (HOSPITAL_COMMUNITY): Payer: Self-pay

## 2022-08-12 DIAGNOSIS — K0889 Other specified disorders of teeth and supporting structures: Secondary | ICD-10-CM

## 2022-08-12 MED ORDER — AMOXICILLIN 500 MG PO CAPS: 500.0000 mg | ORAL_CAPSULE | Freq: Three times a day (TID) | ORAL | 0 refills | Status: AC

## 2022-08-12 MED ORDER — IBUPROFEN 800 MG PO TABS: 800.0000 mg | ORAL_TABLET | Freq: Three times a day (TID) | ORAL | 0 refills | Status: DC

## 2022-08-12 MED ORDER — HYDROCODONE-ACETAMINOPHEN 5-325 MG PO TABS: 1.0000 | ORAL_TABLET | Freq: Four times a day (QID) | ORAL | 0 refills | Status: AC | PRN

## 2022-08-12 MED ORDER — IBUPROFEN 800 MG PO TABS
ORAL_TABLET | ORAL | Status: AC
  Filled 2022-08-12: qty 1

## 2022-08-12 MED ORDER — IBUPROFEN 800 MG PO TABS
800.0000 mg | ORAL_TABLET | Freq: Once | ORAL | Status: AC
  Administered 2022-08-12: 800 mg via ORAL

## 2022-08-12 NOTE — ED Triage Notes (Addendum)
Pt reports dental pain for 3 days. Pt reports tooth is broken .
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
## 2022-08-12 NOTE — Discharge Instructions (Addendum)
You were seen today for dental pain. I have sent out amoxil to take three times/day.  I have sent out motrin to take with food, and several days of hydrocodone for severe pain.  Please make an appointment with a dentist fur further care.  I have given you a list of resources for this.

## 2022-08-15 ENCOUNTER — Emergency Department (HOSPITAL_COMMUNITY)
Admission: EM | Admit: 2022-08-15 | Discharge: 2022-08-16 | Disposition: A | Discharge status: Home / Self Care | Payer: Medicaid Other | Attending: Emergency Medicine | Admitting: Emergency Medicine

## 2022-08-15 DIAGNOSIS — K047 Periapical abscess without sinus: Secondary | ICD-10-CM | POA: Diagnosis present

## 2022-08-15 MED ORDER — IBUPROFEN 400 MG PO TABS
600.0000 mg | ORAL_TABLET | Freq: Once | ORAL | Status: AC
  Administered 2022-08-15: 600 mg via ORAL
  Filled 2022-08-15: qty 1

## 2022-08-15 MED ORDER — OXYCODONE-ACETAMINOPHEN 5-325 MG PO TABS
1.0000 | ORAL_TABLET | Freq: Once | ORAL | Status: AC
  Administered 2022-08-15: 1 via ORAL
  Filled 2022-08-15: qty 1

## 2022-08-15 NOTE — ED Provider Triage Note (Signed)
Emergency Medicine Provider Triage Evaluation Note  [place] , a 31 y.o. female  was evaluated in triage.  Pt complains of dental abscess.  Patient states that symptoms been present for the past 6 to 7 days.  She was discharged from the urgent care on the 17th with hydrocodone, amoxicillin as well as ibuprofen with no relief of symptoms.  She noticed swelling in affected area of tooth.  Denies difficulty breathing/swallowing, fever.  Review of Systems  Positive: See above Negative:   Physical Exam  BP (!) 155/87 (BP Location: Left Arm)   Pulse 82   Temp 98.9 F (37.2 C) (Oral)   Resp 16   LMP 08/11/2022   SpO2 100%  Gen:   Awake, no distress   Resp:  Normal effort see above MSK:   Moves extremities without difficulty  Other:  No edema noted beneath the tongue.  No submandibular swelling.  Uvula midline and symmetric with phonation.  Patient has swelling along gumline of left upper tooth.  Medical Decision Making  Medically screening exam initiated at 7:13 PM.  Appropriate orders placed.  [place] was informed that the remainder of the evaluation will be completed by another provider, this initial triage assessment does not replace that evaluation, and the importance of remaining in the ED until their evaluation is complete.     Peter Garter, [place] 08/15/22 1916

## 2022-08-15 NOTE — ED Triage Notes (Incomplete)
Patient here with complaint of dental pain after tooth broke three days ago in the top left of her mouth. Patient is alert, oriented, and in no apparent distress at this time. Patient seen at urgent care for same on Saturday and was prescribed antibiotics and hydrocodone.

## 2022-08-15 NOTE — ED Notes (Signed)
Pt reports that abscess in top of mouth has started to drain. She is rinsing and spitting it out and reports pain does feel better.

## 2022-08-15 NOTE — ED Provider Notes (Signed)
[facility] EMERGENCY DEPARTMENT Provider Note   CSN: [id number] Arrival date & time: 08/15/22  1712     History {Add pertinent medical, surgical, social history, OB history to HPI:1} Chief Complaint  Patient presents with   Abscess    Erin Hernandez is a 31 y.o. female.  The history is provided by the patient and medical records.  Abscess Erin Hernandez is a 31 y.o. female who presents to the Emergency Department complaining of *** 3 days UC swelling/tongue/throat.  amox Left upper oral mucosa mass.  Drained in waiting room. No trauma.    Ha since Saturday.  Feel like it is re-filling.        Home Medications Prior to Admission medications   Medication Sig Start Date End Date Taking? Authorizing Provider  amoxicillin (AMOXIL) 500 MG capsule Take 1 capsule (500 mg total) by mouth 3 (three) times daily for 10 days. 08/12/22 08/22/22  Piontek, Denny Peon, MD  cetirizine (ZYRTEC ALLERGY) 10 MG tablet Take 1 tablet (10 mg total) by mouth daily. 10/08/20   Wallis Bamberg, PA-C  HYDROcodone-acetaminophen (NORCO/VICODIN) 5-325 MG tablet Take 1-2 tablets by mouth every 6 (six) hours as needed for up to 3 days. 08/12/22 08/15/22  Piontek, Denny Peon, MD  ibuprofen (ADVIL) 800 MG tablet Take 1 tablet (800 mg total) by mouth 3 (three) times daily. 08/12/22   Piontek, Denny Peon, MD  ondansetron (ZOFRAN-ODT) 4 MG disintegrating tablet Take 1 tablet (4 mg total) by mouth every 8 (eight) hours as needed for nausea or vomiting. 02/08/22   Mardella Layman, MD  pantoprazole (PROTONIX) 40 MG tablet Take 1 tablet (40 mg total) by mouth daily. 02/08/22   Mardella Layman, MD  dicyclomine (BENTYL) 20 MG tablet Take 1 tablet (20 mg total) by mouth 2 (two) times daily. 08/07/18 10/08/20  Belinda Fisher, PA-C  Lurasidone HCl (LATUDA PO) Take by mouth.  10/08/20  [provider]      Allergies    Patient has no known allergies.    Review of Systems   Review of Systems  Physical Exam Updated Vital  Signs BP (!) 155/87 (BP Location: Left Arm)   Pulse 82   Temp 98.9 F (37.2 C) (Oral)   Resp 16   LMP 08/11/2022   SpO2 100%  Physical Exam  ED Results / Procedures / Treatments   Labs (all labs ordered are listed, but only abnormal results are displayed) Labs Reviewed - No data to display  EKG None  Radiology No results found.  Procedures Procedures  {Document cardiac monitor, telemetry assessment procedure when appropriate:1}  Medications Ordered in ED Medications  oxyCODONE-acetaminophen (PERCOCET/ROXICET) 5-325 MG per tablet 1 tablet (1 tablet Oral Given 08/15/22 1935)  ibuprofen (ADVIL) tablet 600 mg (600 mg Oral Given 08/15/22 1936)    ED Course/ Medical Decision Making/ A&P                           Medical Decision Making  ***  {Document critical care time when appropriate:1} {Document review of labs and clinical decision tools ie heart score, Chads2Vasc2 etc:1}  {Document your independent review of radiology images, and any outside records:1} {Document your discussion with family members, caretakers, and with consultants:1} {Document social determinants of health affecting pt's care:1} {Document your decision making why or why not admission, treatments were needed:1} Final Clinical Impression(s) / ED Diagnoses Final diagnoses:  None    Rx / DC Orders ED Discharge Orders  None       

## 2022-08-16 MED ORDER — CHLORHEXIDINE GLUCONATE 0.12 % MT SOLN: 15.0000 mL | Freq: Two times a day (BID) | OROMUCOSAL | 0 refills | Status: DC

## 2022-08-16 MED ORDER — BENZOCAINE 20 % MT AERO
INHALATION_SPRAY | Freq: Once | OROMUCOSAL | Status: AC
  Filled 2022-08-16: qty 57

## 2022-08-16 MED ORDER — CLINDAMYCIN HCL 150 MG PO CAPS
300.0000 mg | ORAL_CAPSULE | Freq: Once | ORAL | Status: AC
  Administered 2022-08-16: 300 mg via ORAL
  Filled 2022-08-16: qty 2

## 2022-08-16 MED ORDER — BUPIVACAINE-EPINEPHRINE (PF) 0.5% -1:200000 IJ SOLN
1.8000 mL | Freq: Once | INTRAMUSCULAR | Status: AC
  Administered 2022-08-16: 1.8 mL
  Filled 2022-08-16: qty 1.8

## 2022-08-16 MED ORDER — CLINDAMYCIN HCL 150 MG PO CAPS: 300.0000 mg | ORAL_CAPSULE | Freq: Four times a day (QID) | ORAL | 0 refills | Status: AC

## 2022-08-16 NOTE — ED Notes (Signed)
Patient in room 11 for procedure.

## 2022-10-05 NOTE — Addendum Note (Signed)
Encounter addended by: Caesar Bookman, [place] on: 10/05/2022 5:06 PM  Actions taken: Letter saved

## 2022-10-05 NOTE — Addendum Note (Signed)
Encounter addended by: Caesar Bookman, [place] on: 10/05/2022 5:05 PM  Actions taken: Letter saved

## 2022-11-18 ENCOUNTER — Encounter (HOSPITAL_BASED_OUTPATIENT_CLINIC_OR_DEPARTMENT_OTHER): Payer: Self-pay

## 2022-11-18 ENCOUNTER — Emergency Department (HOSPITAL_BASED_OUTPATIENT_CLINIC_OR_DEPARTMENT_OTHER)
Admission: EM | Admit: 2022-11-18 | Discharge: 2022-11-19 | Disposition: A | Discharge status: Home / Self Care | Payer: Medicaid Other | Attending: Emergency Medicine | Admitting: Emergency Medicine

## 2022-11-18 ENCOUNTER — Other Ambulatory Visit: Payer: Self-pay

## 2022-11-18 DIAGNOSIS — J101 Influenza due to other identified influenza virus with other respiratory manifestations: Secondary | ICD-10-CM | POA: Diagnosis not present

## 2022-11-18 DIAGNOSIS — R059 Cough, unspecified: Secondary | ICD-10-CM | POA: Diagnosis present

## 2022-11-18 DIAGNOSIS — Z1152 Encounter for screening for COVID-19: Secondary | ICD-10-CM | POA: Diagnosis not present

## 2022-11-18 DIAGNOSIS — J45909 Unspecified asthma, uncomplicated: Secondary | ICD-10-CM | POA: Insufficient documentation

## 2022-11-18 LAB — RESP PANEL BY RT-PCR (RSV, FLU A&B, COVID)  RVPGX2
Influenza A by PCR: NEGATIVE
Influenza B by PCR: POSITIVE — AB
Resp Syncytial Virus by PCR: NEGATIVE
SARS Coronavirus 2 by RT PCR: NEGATIVE

## 2022-11-18 LAB — GROUP A STREP BY PCR: Group A Strep by PCR: NOT DETECTED

## 2022-11-18 MED ORDER — ALBUTEROL SULFATE HFA 108 (90 BASE) MCG/ACT IN AERS
2.0000 | INHALATION_SPRAY | Freq: Once | RESPIRATORY_TRACT | Status: AC
  Administered 2022-11-19: 2 via RESPIRATORY_TRACT
  Filled 2022-11-18: qty 6.7

## 2022-11-18 NOTE — ED Triage Notes (Signed)
Patient here POV from Home.  Endorses Aches, Cough, Sore Throat since Tuesday.   NAD Noted during Triage. A&Ox4. GCS 15. Ambulatory.

## 2022-11-19 MED ORDER — AEROCHAMBER PLUS FLO-VU LARGE MISC
1.0000 | Freq: Once | Status: AC
  Administered 2022-11-19: 1
  Filled 2022-11-19: qty 1

## 2022-11-19 NOTE — Discharge Instructions (Signed)
You were seen today for upper respiratory symptoms.  You tested positive for the flu.  Make sure that you are drinking plenty of fluids.  Take Tylenol or ibuprofen as needed for body aches, pains, fevers.
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
## 2022-11-19 NOTE — ED Notes (Signed)
RT educated pt on proper use of MDI w/spacer. Pt able to perform w/out difficulty. Pt verbalizes understanding.

## 2023-07-18 ENCOUNTER — Ambulatory Visit (HOSPITAL_COMMUNITY)
Admission: EM | Admit: 2023-07-18 | Discharge: 2023-07-18 | Disposition: A | Discharge status: Home / Self Care | Payer: 59 | Attending: Internal Medicine | Admitting: Internal Medicine

## 2023-07-18 ENCOUNTER — Ambulatory Visit (INDEPENDENT_AMBULATORY_CARE_PROVIDER_SITE_OTHER): Payer: 59

## 2023-07-18 ENCOUNTER — Encounter (HOSPITAL_COMMUNITY): Payer: Self-pay | Admitting: Emergency Medicine

## 2023-07-18 DIAGNOSIS — S52124A Nondisplaced fracture of head of right radius, initial encounter for closed fracture: Secondary | ICD-10-CM

## 2023-07-18 DIAGNOSIS — S62144A Nondisplaced fracture of body of hamate [unciform] bone, right wrist, initial encounter for closed fracture: Secondary | ICD-10-CM

## 2023-07-18 DIAGNOSIS — Z3202 Encounter for pregnancy test, result negative: Secondary | ICD-10-CM

## 2023-07-18 LAB — POCT URINE PREGNANCY: Preg Test, Ur: NEGATIVE

## 2023-07-18 MED ORDER — KETOROLAC TROMETHAMINE 30 MG/ML IJ SOLN
INTRAMUSCULAR | Status: AC
  Filled 2023-07-18: qty 1

## 2023-07-18 MED ORDER — IBUPROFEN 800 MG PO TABS: 800.0000 mg | ORAL_TABLET | Freq: Three times a day (TID) | ORAL | 0 refills | Status: DC

## 2023-07-18 MED ORDER — HYDROCODONE-ACETAMINOPHEN 5-325 MG PO TABS: 1.0000 | ORAL_TABLET | Freq: Four times a day (QID) | ORAL | 0 refills | Status: DC | PRN

## 2023-07-18 MED ORDER — KETOROLAC TROMETHAMINE 30 MG/ML IJ SOLN
30.0000 mg | Freq: Once | INTRAMUSCULAR | Status: AC
  Administered 2023-07-18: 30 mg via INTRAMUSCULAR
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
## 2023-07-18 NOTE — Progress Notes (Signed)
Orthopedic Tech Progress Note Patient Details:  Zaylie [place] [birth year] [id number]  Ortho Devices Type of Ortho Device: Post (long) splint Splint Material: Fiberglass Ortho Device/Splint Location: RUE Ortho Device/Splint Interventions: Ordered, Application, Adjustment   Post Interventions Patient Tolerated: Well Instructions Provided: Care of device, Adjustment of device  Sherilyn Banker 07/18/2023, 10:58 AM

## 2023-07-19 ENCOUNTER — Ambulatory Visit: Payer: 59 | Admitting: Physician Assistant

## 2023-07-19 ENCOUNTER — Ambulatory Visit (INDEPENDENT_AMBULATORY_CARE_PROVIDER_SITE_OTHER): Payer: 59 | Admitting: Physician Assistant

## 2023-07-19 ENCOUNTER — Encounter: Payer: Self-pay | Admitting: Physician Assistant

## 2023-07-19 VITALS — BP 135/85 | HR 70 | Ht 65.0 in | Wt 244.0 lb

## 2023-07-19 DIAGNOSIS — M79601 Pain in right arm: Secondary | ICD-10-CM | POA: Diagnosis not present

## 2023-07-19 DIAGNOSIS — Z23 Encounter for immunization: Secondary | ICD-10-CM

## 2023-07-19 DIAGNOSIS — Z7689 Persons encountering health services in other specified circumstances: Secondary | ICD-10-CM
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
## 2023-07-25 ENCOUNTER — Ambulatory Visit (INDEPENDENT_AMBULATORY_CARE_PROVIDER_SITE_OTHER): Payer: 59 | Admitting: Physician Assistant

## 2023-07-25 ENCOUNTER — Encounter: Payer: Self-pay | Admitting: Physician Assistant

## 2023-07-25 DIAGNOSIS — M25531 Pain in right wrist: Secondary | ICD-10-CM | POA: Diagnosis not present
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
## 2023-08-03 ENCOUNTER — Encounter: Payer: 59 | Admitting: Physician Assistant

## 2023-08-08 ENCOUNTER — Ambulatory Visit: Payer: 59 | Admitting: Physician Assistant

## 2023-08-14 NOTE — Progress Notes (Unsigned)
  Established patient visit Patient was not seen for appt d/t no call, no show, or late arrival >10 mins past appt time.    Debera Lat PA [facility] [address] [place], [place] [address] [phone] (phone) [phone] (fax) [facility]

## 2023-08-15 ENCOUNTER — Ambulatory Visit (INDEPENDENT_AMBULATORY_CARE_PROVIDER_SITE_OTHER): Payer: 59 | Admitting: Physician Assistant

## 2023-08-15 DIAGNOSIS — Z91199 Patient's noncompliance with other medical treatment and regimen due to unspecified reason: Secondary | ICD-10-CM
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
## 2023-09-07 ENCOUNTER — Encounter: Payer: 59 | Admitting: Physician Assistant

## 2023-09-08 ENCOUNTER — Other Ambulatory Visit (HOSPITAL_COMMUNITY)
Admission: RE | Admit: 2023-09-08 | Discharge: 2023-09-08 | Disposition: A | Discharge status: Home / Self Care | Payer: MEDICAID | Source: Ambulatory Visit | Attending: Physician Assistant | Admitting: Physician Assistant

## 2023-09-08 ENCOUNTER — Ambulatory Visit (INDEPENDENT_AMBULATORY_CARE_PROVIDER_SITE_OTHER): Payer: 59 | Admitting: Physician Assistant

## 2023-09-08 VITALS — BP 132/80 | HR 84 | Temp 98.7°F | Ht 65.0 in | Wt 252.0 lb

## 2023-09-08 DIAGNOSIS — Z124 Encounter for screening for malignant neoplasm of cervix: Secondary | ICD-10-CM | POA: Diagnosis not present

## 2023-09-08 DIAGNOSIS — N898 Other specified noninflammatory disorders of vagina: Secondary | ICD-10-CM | POA: Insufficient documentation

## 2023-09-08 DIAGNOSIS — Z0001 Encounter for general adult medical examination with abnormal findings: Secondary | ICD-10-CM

## 2023-09-08 DIAGNOSIS — N86 Erosion and ectropion of cervix uteri: Secondary | ICD-10-CM

## 2023-09-08 DIAGNOSIS — Z0000 Encounter for general adult medical examination without abnormal findings: Secondary | ICD-10-CM
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
## 2023-09-12 LAB — CERVICOVAGINAL ANCILLARY ONLY
Bacterial Vaginitis (gardnerella): NEGATIVE
Candida Glabrata: NEGATIVE
Candida Vaginitis: NEGATIVE
Comment: NEGATIVE
Comment: NEGATIVE
Comment: NEGATIVE

## 2023-09-13 ENCOUNTER — Encounter: Payer: 59 | Admitting: Obstetrics and Gynecology

## 2023-09-13 DIAGNOSIS — Z7689 Persons encountering health services in other specified circumstances: Secondary | ICD-10-CM

## 2023-09-13 LAB — CERVICOVAGINAL ANCILLARY ONLY
Chlamydia: NEGATIVE
Comment: NEGATIVE
Comment: NEGATIVE
Comment: NEGATIVE
Comment: NEGATIVE
Comment: NORMAL
HSV1: NEGATIVE
HSV2: NEGATIVE
High risk HPV: NEGATIVE
Neisseria Gonorrhea: NEGATIVE
Trichomonas: NEGATIVE

## 2023-09-15 ENCOUNTER — Encounter: Payer: Self-pay | Admitting: Obstetrics and Gynecology

## 2023-11-03 ENCOUNTER — Other Ambulatory Visit: Payer: Self-pay | Admitting: Family Medicine

## 2023-11-03 ENCOUNTER — Ambulatory Visit: Payer: Self-pay

## 2023-11-03 DIAGNOSIS — M25512 Pain in left shoulder: Secondary | ICD-10-CM

## 2023-11-03 DIAGNOSIS — M25571 Pain in right ankle and joints of right foot: Secondary | ICD-10-CM

## 2023-11-10 ENCOUNTER — Ambulatory Visit (INDEPENDENT_AMBULATORY_CARE_PROVIDER_SITE_OTHER): Payer: MEDICAID | Admitting: Physician Assistant

## 2023-11-10 ENCOUNTER — Encounter: Payer: Self-pay | Admitting: Physician Assistant

## 2023-11-10 VITALS — BP 127/96 | HR 81 | Ht 65.0 in | Wt 262.0 lb

## 2023-11-10 DIAGNOSIS — J3089 Other allergic rhinitis: Secondary | ICD-10-CM | POA: Diagnosis not present

## 2023-11-10 DIAGNOSIS — M79601 Pain in right arm: Secondary | ICD-10-CM

## 2023-11-10 MED ORDER — CELECOXIB 200 MG PO CAPS: 200.0000 mg | ORAL_CAPSULE | Freq: Two times a day (BID) | ORAL | 1 refills | Status: DC

## 2023-11-10 MED ORDER — FLUTICASONE PROPIONATE 50 MCG/ACT NA SUSP: 2.0000 | Freq: Every day | NASAL | 6 refills | Status: DC

## 2023-11-10 MED ORDER — CETIRIZINE HCL 10 MG PO TABS: 10.0000 mg | ORAL_TABLET | Freq: Every day | ORAL | 11 refills | Status: DC

## 2023-11-10 MED ORDER — OMEPRAZOLE 40 MG PO CPDR: 40.0000 mg | DELAYED_RELEASE_CAPSULE | Freq: Every day | ORAL | 3 refills | Status: DC

## 2023-11-11 NOTE — Progress Notes (Signed)
 Established patient visit  Patient: Erin Hernandez   DOB: [birth year]   33 y.o. Female  MRN: [id number] Visit Date: 11/10/2023  Today's healthcare provider: Debera Lat, PA-C   Chief Complaint  Patient presents with   Genia Hotter at work ([employer]) 11/02/2022.  Left ankle injury   Subjective     Discussed the use of AI scribe software for clinical note transcription with the patient, who gave verbal consent to proceed.  History of Present Illness   The patient, with a history of obesity and acid reflux, presents with a work-related ankle sprain. She slipped on a wet surface at work and has been experiencing persistent pain since the incident. She reports that the pain is not adequately managed with Tylenol and she is seeking stronger pain relief. She has been using crutches and a brace for support, but she expresses dissatisfaction with the brace, describing it as flimsy and lacking in support. She has been advised to return to work, where she will be seated, but she expresses concern about re-injuring herself due to the slippery conditions at her workplace.  In addition to the ankle sprain, she reports having dust allergies which are exacerbated by the conditions at her workplace. She requests a prescription for Zyrtec to manage her allergy symptoms. She also mentions having acid reflux, which is exacerbated by certain medications.           09/08/2023   11:11 AM  Depression screen PHQ 2/9  Decreased Interest 2  Down, Depressed, Hopeless 1  PHQ - 2 Score 3  Altered sleeping 3  Tired, decreased energy 3  Change in appetite 3  Feeling bad or failure about yourself  1  Trouble concentrating 0  Moving slowly or fidgety/restless 0  Suicidal thoughts 0  PHQ-9 Score 13  Difficult doing work/chores Somewhat difficult      09/08/2023   11:11 AM  GAD 7 : Generalized Anxiety Score  Nervous, Anxious, on Edge 2  Control/stop worrying 1  Worry too much - different things 3   Trouble relaxing 1  Restless 0  Easily annoyed or irritable 1  Afraid - awful might happen 1  Total GAD 7 Score 9  Anxiety Difficulty Somewhat difficult    Medications: Outpatient Medications Prior to Visit  Medication Sig   [DISCONTINUED] cetirizine (ZYRTEC ALLERGY) 10 MG tablet Take 1 tablet (10 mg total) by mouth daily.   [DISCONTINUED] chlorhexidine (PERIDEX) 0.12 % solution Use as directed 15 mLs in the mouth or throat 2 (two) times daily.   [DISCONTINUED] HYDROcodone-acetaminophen (NORCO/VICODIN) 5-325 MG tablet Take 1 tablet by mouth every 6 (six) hours as needed.   [DISCONTINUED] ibuprofen (ADVIL) 800 MG tablet Take 1 tablet (800 mg total) by mouth 3 (three) times daily.   [DISCONTINUED] ondansetron (ZOFRAN-ODT) 4 MG disintegrating tablet Take 1 tablet (4 mg total) by mouth every 8 (eight) hours as needed for nausea or vomiting.   [DISCONTINUED] pantoprazole (PROTONIX) 40 MG tablet Take 1 tablet (40 mg total) by mouth daily.   No facility-administered medications prior to visit.    Review of Systems All negative Except see HPI       Objective    BP (!) 127/96   Pulse 81   Ht 5\' 5"  (1.651 m)   Wt 262 lb (118.8 kg)   LMP 10/25/2023 (Approximate)   BMI 43.60 kg/m     Physical Exam Vitals reviewed.  Constitutional:      General: She is not in acute distress.  Appearance: Normal appearance. She is well-developed. She is not diaphoretic.  HENT:     Head: Normocephalic and atraumatic.  Eyes:     General: No scleral icterus.    Conjunctiva/sclera: Conjunctivae normal.  Neck:     Thyroid: No thyromegaly.  Cardiovascular:     Rate and Rhythm: Normal rate and regular rhythm.     Pulses: Normal pulses.     Heart sounds: Normal heart sounds. No murmur heard. Pulmonary:     Effort: Pulmonary effort is normal. No respiratory distress.     Breath sounds: Normal breath sounds. No wheezing, rhonchi or rales.  Musculoskeletal:     Cervical back: Neck supple.      Right lower leg: No edema.     Left lower leg: No edema.     Comments: Ankle brace appreciated on the right ankle  Lymphadenopathy:     Cervical: No cervical adenopathy.  Skin:    General: Skin is warm and dry.     Findings: No rash.  Neurological:     Mental Status: She is alert and oriented to person, place, and time. Mental status is at baseline.  Psychiatric:        Mood and Affect: Mood normal.        Behavior: Behavior normal.      No results found for any visits on 11/10/23.      Assessment and Plan    Ankle Sprain Work-related injury with persistent pain despite Tylenol. No fracture identified on imaging. -Prescribe  celebrex with meals for pain relief. Continue taking tylenol daily as well. Take omeprazole for possible acid reflux and stomach lining protection  Allergic Rhinitis Reports dust allergy with sinus symptoms. -Prescribe Zyrtec and Flonase to manage symptoms.  Obesity/Weight Management Patient has expressed interest in gastric sleeve surgery, but is being referred to a weight loss program first. -Continue with scheduled appointment for weight loss program on 11/16/2023.  Follow-up -Review progress with weight loss program and assess ankle pain on next visit.      No orders of the defined types were placed in this encounter.   Return in about 3 months (around 02/07/2024) for chronic disease f/u.   The patient was advised to call back or seek an in-person evaluation if the symptoms worsen or if the condition fails to improve as anticipated.  I discussed the assessment and treatment plan with the patient. The patient was provided an opportunity to ask questions and all were answered. The patient agreed with the plan and demonstrated an understanding of the instructions.  I, Debera Lat, PA-C have reviewed all documentation for this visit. The documentation on 11/10/2023  for the exam, diagnosis, procedures, and orders are all accurate and  complete.  Debera Lat, [facility], MMS [facility] [phone] (phone) [phone] (fax)  [facility]

## 2023-11-16 ENCOUNTER — Encounter: Payer: 59 | Admitting: Family Medicine

## 2023-11-29 ENCOUNTER — Encounter: Payer: 59 | Admitting: Family Medicine

## 2023-12-28 ENCOUNTER — Inpatient Hospital Stay (HOSPITAL_COMMUNITY)
Admission: EM | Admit: 2023-12-28 | Discharge: 2024-01-01 | DRG: 394 | Disposition: A | Discharge status: Home / Self Care | Payer: MEDICAID | Attending: General Surgery | Admitting: General Surgery

## 2023-12-28 ENCOUNTER — Other Ambulatory Visit: Payer: Self-pay

## 2023-12-28 ENCOUNTER — Emergency Department (HOSPITAL_COMMUNITY): Payer: MEDICAID

## 2023-12-28 DIAGNOSIS — F250 Schizoaffective disorder, bipolar type: Secondary | ICD-10-CM | POA: Diagnosis present

## 2023-12-28 DIAGNOSIS — F411 Generalized anxiety disorder: Secondary | ICD-10-CM

## 2023-12-28 DIAGNOSIS — W3400XA Accidental discharge from unspecified firearms or gun, initial encounter: Principal | ICD-10-CM

## 2023-12-28 DIAGNOSIS — S31634A Puncture wound without foreign body of abdominal wall, left lower quadrant with penetration into peritoneal cavity, initial encounter: Principal | ICD-10-CM | POA: Diagnosis present

## 2023-12-28 DIAGNOSIS — Z79899 Other long term (current) drug therapy: Secondary | ICD-10-CM

## 2023-12-28 DIAGNOSIS — S71132A Puncture wound without foreign body, left thigh, initial encounter: Secondary | ICD-10-CM | POA: Diagnosis present

## 2023-12-28 DIAGNOSIS — F419 Anxiety disorder, unspecified: Secondary | ICD-10-CM | POA: Diagnosis present

## 2023-12-28 DIAGNOSIS — S51832A Puncture wound without foreign body of left forearm, initial encounter: Secondary | ICD-10-CM | POA: Diagnosis present

## 2023-12-28 DIAGNOSIS — Z6841 Body Mass Index (BMI) 40.0 and over, adult: Secondary | ICD-10-CM

## 2023-12-28 DIAGNOSIS — Z23 Encounter for immunization: Secondary | ICD-10-CM

## 2023-12-28 DIAGNOSIS — Z91148 Patient's other noncompliance with medication regimen for other reason: Secondary | ICD-10-CM

## 2023-12-28 DIAGNOSIS — S41132A Puncture wound without foreign body of left upper arm, initial encounter: Secondary | ICD-10-CM | POA: Diagnosis present

## 2023-12-28 DIAGNOSIS — F17210 Nicotine dependence, cigarettes, uncomplicated: Secondary | ICD-10-CM | POA: Diagnosis present

## 2023-12-28 HISTORY — DX: Unspecified asthma, uncomplicated: J45.909

## 2023-12-28 HISTORY — DX: Schizoaffective disorder, unspecified: F25.9

## 2023-12-28 LAB — I-STAT CHEM 8, ED
BUN: 10 mg/dL (ref 6–20)
Calcium, Ion: 1.12 mmol/L — ABNORMAL LOW (ref 1.15–1.40)
Chloride: 105 mmol/L (ref 98–111)
Creatinine, Ser: 1.1 mg/dL — ABNORMAL HIGH (ref 0.44–1.00)
Glucose, Bld: 134 mg/dL — ABNORMAL HIGH (ref 70–99)
HCT: 43 % (ref 36.0–46.0)
Hemoglobin: 14.6 g/dL (ref 12.0–15.0)
Potassium: 3.7 mmol/L (ref 3.5–5.1)
Sodium: 139 mmol/L (ref 135–145)
TCO2: 22 mmol/L (ref 22–32)

## 2023-12-28 LAB — CBC
HCT: 39.7 % (ref 36.0–46.0)
Hemoglobin: 13 g/dL (ref 12.0–15.0)
MCH: 29.5 pg (ref 26.0–34.0)
MCHC: 32.7 g/dL (ref 30.0–36.0)
MCV: 90.2 fL (ref 80.0–100.0)
Platelets: 258 10*3/uL (ref 150–400)
RBC: 4.4 MIL/uL (ref 3.87–5.11)
RDW: 15 % (ref 11.5–15.5)
WBC: 16.9 10*3/uL — ABNORMAL HIGH (ref 4.0–10.5)
nRBC: 0 % (ref 0.0–0.2)

## 2023-12-28 LAB — COMPREHENSIVE METABOLIC PANEL WITH GFR
ALT: 19 U/L (ref 0–44)
AST: 23 U/L (ref 15–41)
Albumin: 3.7 g/dL (ref 3.5–5.0)
Alkaline Phosphatase: 55 U/L (ref 38–126)
Anion gap: 11 (ref 5–15)
BUN: 10 mg/dL (ref 6–20)
CO2: 21 mmol/L — ABNORMAL LOW (ref 22–32)
Calcium: 8.6 mg/dL — ABNORMAL LOW (ref 8.9–10.3)
Chloride: 105 mmol/L (ref 98–111)
Creatinine, Ser: 0.94 mg/dL (ref 0.44–1.00)
GFR, Estimated: >60 mL/min (ref >60)
Glucose, Bld: 135 mg/dL — ABNORMAL HIGH (ref 70–99)
Potassium: 3.7 mmol/L (ref 3.5–5.1)
Sodium: 137 mmol/L (ref 135–145)
Total Bilirubin: 0.4 mg/dL (ref 0.0–1.2)
Total Protein: 6.8 g/dL (ref 6.5–8.1)

## 2023-12-28 LAB — SAMPLE TO BLOOD BANK

## 2023-12-28 LAB — PROTIME-INR
INR: 1 (ref 0.8–1.2)
Prothrombin Time: 13.8 s (ref 11.4–15.2)

## 2023-12-28 LAB — ETHANOL: Alcohol, Ethyl (B): 135 mg/dL — ABNORMAL HIGH (ref <10)

## 2023-12-28 LAB — I-STAT CG4 LACTIC ACID, ED: Lactic Acid, Venous: 2.2 mmol/L (ref 0.5–1.9)

## 2023-12-28 MED ORDER — POLYETHYLENE GLYCOL 3350 17 G PO PACK
17.0000 g | PACK | Freq: Every day | ORAL | Status: DC | PRN
  Administered 2023-12-30: 17 g via ORAL
  Filled 2023-12-28: qty 1

## 2023-12-28 MED ORDER — LORAZEPAM 2 MG/ML IJ SOLN
2.0000 mg | INTRAMUSCULAR | Status: DC | PRN
  Administered 2023-12-29 – 2023-12-30 (×2): 2 mg via INTRAVENOUS
  Filled 2023-12-28 (×2): qty 1

## 2023-12-28 MED ORDER — OXYCODONE HCL 5 MG PO TABS
10.0000 mg | ORAL_TABLET | ORAL | Status: DC | PRN
  Administered 2023-12-29: 10 mg via ORAL
  Filled 2023-12-28: qty 2

## 2023-12-28 MED ORDER — HYDROMORPHONE HCL 1 MG/ML IJ SOLN
0.5000 mg | INTRAMUSCULAR | Status: DC | PRN
  Administered 2023-12-29: 0.5 mg via INTRAVENOUS
  Administered 2023-12-29: 1 mg via INTRAVENOUS
  Filled 2023-12-28: qty 1
  Filled 2023-12-28 (×2): qty 0.5

## 2023-12-28 MED ORDER — OXYCODONE HCL 5 MG PO TABS: 5.0000 mg | ORAL_TABLET | ORAL | Status: DC | PRN

## 2023-12-28 MED ORDER — FENTANYL CITRATE PF 50 MCG/ML IJ SOSY
PREFILLED_SYRINGE | INTRAMUSCULAR | Status: AC
  Filled 2023-12-28: qty 1

## 2023-12-28 MED ORDER — METHOCARBAMOL 1000 MG/10ML IJ SOLN: 500.0000 mg | Freq: Three times a day (TID) | INTRAMUSCULAR | Status: DC

## 2023-12-28 MED ORDER — TETANUS-DIPHTH-ACELL PERTUSSIS 5-2.5-18.5 LF-MCG/0.5 IM SUSY
0.5000 mL | PREFILLED_SYRINGE | Freq: Once | INTRAMUSCULAR | Status: AC
  Administered 2023-12-29: 0.5 mL via INTRAMUSCULAR
  Filled 2023-12-28 (×2): qty 0.5

## 2023-12-28 MED ORDER — DOCUSATE SODIUM 100 MG PO CAPS
100.0000 mg | ORAL_CAPSULE | Freq: Two times a day (BID) | ORAL | Status: DC
  Administered 2023-12-29 – 2024-01-01 (×7): 100 mg via ORAL
  Filled 2023-12-28 (×7): qty 1

## 2023-12-28 MED ORDER — METOPROLOL TARTRATE 5 MG/5ML IV SOLN: 5.0000 mg | Freq: Four times a day (QID) | INTRAVENOUS | Status: DC | PRN

## 2023-12-28 MED ORDER — IOHEXOL 350 MG/ML SOLN
200.0000 mL | Freq: Once | INTRAVENOUS | Status: AC | PRN
  Administered 2023-12-28: 200 mL via INTRAVENOUS

## 2023-12-28 MED ORDER — HYDRALAZINE HCL 20 MG/ML IJ SOLN
10.0000 mg | INTRAMUSCULAR | Status: DC | PRN
  Filled 2023-12-28: qty 1

## 2023-12-28 MED ORDER — ONDANSETRON HCL 4 MG/2ML IJ SOLN: 4.0000 mg | Freq: Four times a day (QID) | INTRAMUSCULAR | Status: DC | PRN

## 2023-12-28 MED ORDER — MIDAZOLAM HCL 2 MG/2ML IJ SOLN
INTRAMUSCULAR | Status: AC
  Filled 2023-12-28: qty 2

## 2023-12-28 MED ORDER — METHOCARBAMOL 500 MG PO TABS: 500.0000 mg | ORAL_TABLET | Freq: Three times a day (TID) | ORAL | Status: DC

## 2023-12-28 MED ORDER — ONDANSETRON 4 MG PO TBDP: 4.0000 mg | ORAL_TABLET | Freq: Four times a day (QID) | ORAL | Status: DC | PRN

## 2023-12-28 MED ORDER — FENTANYL CITRATE PF 50 MCG/ML IJ SOSY
50.0000 ug | PREFILLED_SYRINGE | Freq: Once | INTRAMUSCULAR | Status: AC
  Administered 2023-12-28: 50 ug via INTRAVENOUS

## 2023-12-28 MED ORDER — ACETAMINOPHEN 500 MG PO TABS
1000.0000 mg | ORAL_TABLET | Freq: Four times a day (QID) | ORAL | Status: DC
  Administered 2023-12-29 – 2024-01-01 (×12): 1000 mg via ORAL
  Filled 2023-12-28 (×14): qty 2

## 2023-12-28 MED ORDER — MIDAZOLAM HCL 2 MG/2ML IJ SOLN
2.0000 mg | Freq: Once | INTRAMUSCULAR | Status: AC
  Administered 2023-12-28: 2 mg via INTRAVENOUS
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
## 2023-12-28 NOTE — ED Notes (Signed)
 Vascular MD at bedside.
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
## 2023-12-28 NOTE — ED Notes (Signed)
 Pt transported to CT 1 by this RN accompanied by TRN emilie, NT Mardene Sayer, Trauma MD, and Vascular MD. Pt remains on VS monitor and NRB

## 2023-12-28 NOTE — ED Notes (Signed)
 Provider and TCRN working on IV via [place] at this time.
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
## 2023-12-28 NOTE — Progress Notes (Signed)
 Chaplain responded to a Trauma Level 1 code page. Pt was not available, was taken to CT. Pt's mother, Lynden Ang was contacted and confirmed she was on her way to the hospital. Spiritual support available but not needed at this time.  Oneida Alar Chaplain resident   12/28/23 2258  Spiritual Encounters  Type of Visit Initial  Care provided to: Pt not available;Family  Conversation partners present during encounter Nurse;Social worker/Care management/TOC  Referral source Trauma page  Reason for visit Trauma  OnCall Visit Yes
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
## 2023-12-28 NOTE — ED Notes (Signed)
 Removed NRB - pt was 100 on room air. Was placed on nasal cannula 2 L for comfort .

## 2023-12-28 NOTE — ED Notes (Signed)
 Jewelry previously inventoried was given to mother Lynden Ang at bedside
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
## 2023-12-28 NOTE — ED Notes (Signed)
 Pt provided contact information for her mother Lynden Ang. She can be reached at [phone]

## 2023-12-28 NOTE — ED Notes (Signed)
 Jewelry removed from pt inventoried with NT Kaitlyn.   P charm yellow gold necklace  Bear chain necklace  Star chain necklace [employer] logo chain necklace  Money bag chain necklace Plain slim necklace  9 charm chain necklace 2 plain braided chain necklaces  Woman profile charm  Large plain chain bracelet  Small thread braided chain bracelet  And bear chain bracelet

## 2023-12-29 ENCOUNTER — Encounter (HOSPITAL_COMMUNITY): Payer: Self-pay | Admitting: General Surgery

## 2023-12-29 ENCOUNTER — Other Ambulatory Visit (HOSPITAL_COMMUNITY): Payer: Self-pay

## 2023-12-29 DIAGNOSIS — Z23 Encounter for immunization: Secondary | ICD-10-CM | POA: Diagnosis not present

## 2023-12-29 DIAGNOSIS — S31634A Puncture wound without foreign body of abdominal wall, left lower quadrant with penetration into peritoneal cavity, initial encounter: Secondary | ICD-10-CM | POA: Diagnosis present

## 2023-12-29 DIAGNOSIS — F419 Anxiety disorder, unspecified: Secondary | ICD-10-CM | POA: Diagnosis present

## 2023-12-29 DIAGNOSIS — Z91148 Patient's other noncompliance with medication regimen for other reason: Secondary | ICD-10-CM | POA: Diagnosis not present

## 2023-12-29 DIAGNOSIS — S71132A Puncture wound without foreign body, left thigh, initial encounter: Secondary | ICD-10-CM | POA: Diagnosis present

## 2023-12-29 DIAGNOSIS — S3991XA Unspecified injury of abdomen, initial encounter: Secondary | ICD-10-CM | POA: Diagnosis present

## 2023-12-29 DIAGNOSIS — F17210 Nicotine dependence, cigarettes, uncomplicated: Secondary | ICD-10-CM | POA: Diagnosis present

## 2023-12-29 DIAGNOSIS — F411 Generalized anxiety disorder: Secondary | ICD-10-CM | POA: Diagnosis not present

## 2023-12-29 DIAGNOSIS — Z79899 Other long term (current) drug therapy: Secondary | ICD-10-CM | POA: Diagnosis not present

## 2023-12-29 DIAGNOSIS — Z6841 Body Mass Index (BMI) 40.0 and over, adult: Secondary | ICD-10-CM | POA: Diagnosis not present

## 2023-12-29 DIAGNOSIS — F250 Schizoaffective disorder, bipolar type: Secondary | ICD-10-CM | POA: Diagnosis present

## 2023-12-29 DIAGNOSIS — S41132A Puncture wound without foreign body of left upper arm, initial encounter: Secondary | ICD-10-CM | POA: Diagnosis present

## 2023-12-29 DIAGNOSIS — S51832A Puncture wound without foreign body of left forearm, initial encounter: Secondary | ICD-10-CM | POA: Diagnosis present

## 2023-12-29 LAB — BASIC METABOLIC PANEL WITH GFR
Anion gap: 10 (ref 5–15)
BUN: 8 mg/dL (ref 6–20)
CO2: 22 mmol/L (ref 22–32)
Calcium: 8.6 mg/dL — ABNORMAL LOW (ref 8.9–10.3)
Chloride: 101 mmol/L (ref 98–111)
Creatinine, Ser: 0.83 mg/dL (ref 0.44–1.00)
GFR, Estimated: >60 mL/min (ref >60)
Glucose, Bld: 114 mg/dL — ABNORMAL HIGH (ref 70–99)
Potassium: 4.1 mmol/L (ref 3.5–5.1)
Sodium: 133 mmol/L — ABNORMAL LOW (ref 135–145)

## 2023-12-29 LAB — CBC
HCT: 35.7 % — ABNORMAL LOW (ref 36.0–46.0)
Hemoglobin: 11.8 g/dL — ABNORMAL LOW (ref 12.0–15.0)
MCH: 28.9 pg (ref 26.0–34.0)
MCHC: 33.1 g/dL (ref 30.0–36.0)
MCV: 87.5 fL (ref 80.0–100.0)
Platelets: 246 10*3/uL (ref 150–400)
RBC: 4.08 MIL/uL (ref 3.87–5.11)
RDW: 15.1 % (ref 11.5–15.5)
WBC: 12.5 10*3/uL — ABNORMAL HIGH (ref 4.0–10.5)
nRBC: 0 % (ref 0.0–0.2)

## 2023-12-29 LAB — HCG, SERUM, QUALITATIVE: Preg, Serum: NEGATIVE

## 2023-12-29 LAB — HIV ANTIBODY (ROUTINE TESTING W REFLEX): HIV Screen 4th Generation wRfx: NONREACTIVE

## 2023-12-29 MED ORDER — METHOCARBAMOL 500 MG PO TABS
1000.0000 mg | ORAL_TABLET | Freq: Four times a day (QID) | ORAL | Status: DC
  Administered 2023-12-29 – 2024-01-01 (×13): 1000 mg via ORAL
  Filled 2023-12-29 (×13): qty 2

## 2023-12-29 MED ORDER — METHOCARBAMOL 1000 MG/10ML IJ SOLN
500.0000 mg | Freq: Four times a day (QID) | INTRAMUSCULAR | Status: DC
  Filled 2023-12-29: qty 10

## 2023-12-29 MED ORDER — HYDROMORPHONE HCL 1 MG/ML IJ SOLN
1.0000 mg | INTRAMUSCULAR | Status: DC | PRN
  Administered 2023-12-29 – 2023-12-30 (×6): 1 mg via INTRAVENOUS
  Filled 2023-12-29 (×8): qty 1

## 2023-12-29 MED ORDER — IBUPROFEN 600 MG PO TABS
600.0000 mg | ORAL_TABLET | Freq: Three times a day (TID) | ORAL | Status: DC
  Administered 2023-12-29 – 2023-12-30 (×4): 600 mg via ORAL
  Filled 2023-12-29 (×4): qty 1

## 2023-12-29 MED ORDER — OXYCODONE HCL 5 MG PO TABS
5.0000 mg | ORAL_TABLET | ORAL | Status: DC | PRN
  Administered 2023-12-29: 5 mg via ORAL
  Administered 2023-12-29 – 2023-12-30 (×5): 10 mg via ORAL
  Filled 2023-12-29 (×6): qty 2

## 2023-12-29 MED ORDER — BACITRACIN ZINC 500 UNIT/GM EX OINT
1.0000 | TOPICAL_OINTMENT | Freq: Two times a day (BID) | CUTANEOUS | Status: DC
  Administered 2023-12-29 – 2024-01-01 (×6): 1 via TOPICAL
  Filled 2023-12-29 (×2): qty 28.35

## 2023-12-29 MED ORDER — ENOXAPARIN SODIUM 30 MG/0.3ML IJ SOSY
30.0000 mg | PREFILLED_SYRINGE | Freq: Two times a day (BID) | INTRAMUSCULAR | Status: DC
  Administered 2023-12-30 – 2024-01-01 (×5): 30 mg via SUBCUTANEOUS
  Filled 2023-12-29 (×5): qty 0.3

## 2023-12-29 MED ORDER — LACTATED RINGERS IV SOLN: INTRAVENOUS | Status: AC
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
## 2023-12-29 NOTE — Progress Notes (Signed)
 Patient arrive to [facility] room 24,Transferred from stretcher to the bed, Alert and oriented x4.Pain level at 10.Refused to be assessed.Verbal order by MD to give 1mg  Dilaudid.Relieve.Bed in lowest position.Call light in reach.Family at bedside.

## 2023-12-29 NOTE — Progress Notes (Signed)
 Orthopedic Tech Progress Note Patient Details:  Erin Hernandez [place] 07-11-1991 [id number]  Ortho Devices Type of Ortho Device: CAM walker Ortho Device/Splint Location: RLE Ortho Device/Splint Interventions: Ordered, Application, Adjustment   Post Interventions Patient Tolerated: Well Instructions Provided: Adjustment of device  Erin Hernandez 12/29/2023, 11:32 AM
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
## 2023-12-29 NOTE — Discharge Instructions (Signed)
PTSD Brochure [email or web address]  Trauma Resources [facility]:  Address: [address], [place], [place] [address] Hours:  Closes 5?PM Phone: [phone]  [facility] [place]:  Located in: [facility] Address: [address] [place], [place] [address] Areas served:  [employer]:  Closes 8?PM  Phone: [phone]  Trauma Focused Therapist(s):   Dannielle Karvonen [address] [place], [place] [address]  [phone]  Jerelyn Charles [facility] [place], [place] [address]  [email or web address]  [facility] Counselor, [place], [facility], [facility], [facility] [phone] and Pamala Duffel [place], [place] [address] [phone]  [facility] [address] [place], [place] [address]  [phone]   [facility] [address] [place], [place] [address] [place] Phone: [phone]  Trauma Support Groups [facility] [address] [place], [place] [address]   [facility] [address] [place]   Phone: [phone]  [facility]  [phone] [address] [address] [place], [place] [address]  [facility] (Emotional Abuse Survivor) Lind Guest [phone]  The [address] [place], [place] [address]  [phone]  M[facility] Services [facility] [address] [place] [address] [phone]  52 Suicide and Crisis Lifeline 988 [email or web address]  [facility] Website: [email or web address]   24-Hour Call Center: [phone]   Behavioral Health Crisis Line: [phone]   [facility]  Website: [email or web address]  Address: [address] [place], [place], [place] [address]  Contact: [phone]  [facility] Website: [email or web address]  Address: [address], [place], [place] [address]  Contact: [phone]
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
## 2023-12-29 NOTE — Plan of Care (Signed)
  Problem: Pain Managment: Goal: General experience of comfort will improve and/or be controlled Outcome: Progressing   Problem: Safety: Goal: Ability to remain free from injury will improve Outcome: Progressing

## 2023-12-29 NOTE — Plan of Care (Signed)
   Problem: Education: Goal: Knowledge of General Education information will improve Description: Including pain rating scale, medication(s)/side effects and non-pharmacologic comfort measures Outcome: Progressing   Problem: Pain Managment: Goal: General experience of comfort will improve and/or be controlled Outcome: Progressing   Problem: Safety: Goal: Ability to remain free from injury will improve Outcome: Progressing

## 2023-12-29 NOTE — Progress Notes (Signed)
  Inpatient Rehab Admissions Coordinator :  Per therapy recommendations, patient was screened for CIR candidacy by Ottie Glazier RN MSN.  At this time patient appears to be a potential candidate for CIR. I will place a rehab consult per protocol for full assessment. Please call me with any questions.  Ottie Glazier RN MSN Admissions Coordinator [phone]
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
## 2023-12-29 NOTE — Progress Notes (Signed)
Patient on menstrual cycle.

## 2023-12-30 DIAGNOSIS — F411 Generalized anxiety disorder: Secondary | ICD-10-CM

## 2023-12-30 DIAGNOSIS — F250 Schizoaffective disorder, bipolar type: Secondary | ICD-10-CM | POA: Diagnosis present

## 2023-12-30 MED ORDER — GABAPENTIN 300 MG PO CAPS
300.0000 mg | ORAL_CAPSULE | Freq: Three times a day (TID) | ORAL | Status: DC
  Administered 2023-12-30 – 2024-01-01 (×7): 300 mg via ORAL
  Filled 2023-12-30 (×7): qty 1

## 2023-12-30 MED ORDER — OXYCODONE HCL 5 MG PO TABS
5.0000 mg | ORAL_TABLET | ORAL | Status: DC | PRN
  Administered 2023-12-30 – 2024-01-01 (×6): 10 mg via ORAL
  Filled 2023-12-30 (×8): qty 2

## 2023-12-30 MED ORDER — LIDOCAINE 5 % EX PTCH
3.0000 | MEDICATED_PATCH | Freq: Every day | CUTANEOUS | Status: DC
  Administered 2023-12-30 – 2024-01-01 (×3): 3 via TRANSDERMAL
  Filled 2023-12-30 (×4): qty 3

## 2023-12-30 MED ORDER — HYDROMORPHONE HCL 1 MG/ML IJ SOLN
0.5000 mg | INTRAMUSCULAR | Status: DC | PRN
  Administered 2023-12-30 – 2024-01-01 (×8): 0.5 mg via INTRAVENOUS
  Filled 2023-12-30 (×8): qty 0.5

## 2023-12-30 MED ORDER — HYDROXYZINE HCL 25 MG PO TABS
25.0000 mg | ORAL_TABLET | Freq: Three times a day (TID) | ORAL | Status: DC | PRN
  Administered 2024-01-01 (×2): 25 mg via ORAL
  Filled 2023-12-30 (×2): qty 1

## 2023-12-30 MED ORDER — IBUPROFEN 600 MG PO TABS
600.0000 mg | ORAL_TABLET | Freq: Four times a day (QID) | ORAL | Status: DC
  Administered 2023-12-30 – 2024-01-01 (×9): 600 mg via ORAL
  Filled 2023-12-30 (×9): qty 1

## 2023-12-30 NOTE — Progress Notes (Signed)
 Inpatient Rehab Admissions Coordinator:  Consult received. Note PT recommending outpatient therapy. Also note pt being discharged today. AC will sign off.   Wolfgang Phoenix, MS, CCC-SLP Admissions Coordinator [phone]
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
## 2023-12-30 NOTE — TOC Transition Note (Addendum)
 Transition of Care [facility]) - Discharge Note   Patient Details  Name: Erin Hernandez [place] MRN: [id number] Date of Birth: [birth year]  Transition of Care [facility]) CM/SW Contact:  Ronny Bacon, RN Phone Number: 12/30/2023, 2:54 PM   Clinical Narrative:  Patient to be discharged today. DME hospital bed, BSC/3:1, Wheelchair ordered through [facility] with [employer] to be delivered to patient home.  Outpatient PT referral # [phone] to [facility]. Contact information on AVS.    Final next level of care: OP Rehab Barriers to Discharge: No Barriers Identified   Patient Goals and CMS Choice            Discharge Placement                       Discharge Plan and Services Additional resources added to the After Visit Summary for                  DME Arranged: 3-N-1, Bedside commode, Wheelchair manual, Hospital bed DME Agency: [employer] Date DME Agency Contacted: 12/30/23 Time DME Agency Contacted: [id number] Representative spoke with at DME Agency: Vaughan Basta            Social Drivers of Health (SDOH) Interventions SDOH Screenings   Food Insecurity: No Food Insecurity (12/29/2023)  Housing: Low Risk  (12/29/2023)  Transportation Needs: No Transportation Needs (12/29/2023)  Utilities: Not At Risk (12/29/2023)  Tobacco Use: Low Risk  (12/29/2023)     Readmission Risk Interventions     No data to display
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
## 2023-12-30 NOTE — Plan of Care (Signed)
  Problem: Pain Managment: Goal: General experience of comfort will improve and/or be controlled Outcome: Progressing   Problem: Safety: Goal: Ability to remain free from injury will improve Outcome: Progressing

## 2023-12-30 NOTE — Progress Notes (Signed)
 PT Cancellation Note  Patient Details Name: Laquonda [place] MRN: [id number] DOB: 1991-02-01   Cancelled Treatment:    Reason Eval/Treat Not Completed: Fatigue/lethargy limiting ability to participate (Pt very lethargic after receiving anxiety meds. Mobility not safe at this time due to pt low arousal. Will follow up tomorrow.)   Gladys Damme 12/30/2023, 10:22 AM

## 2023-12-30 NOTE — Progress Notes (Addendum)
 OT Cancellation Note  Patient Details Name: Erin Hernandez [place] MRN: [id number] DOB: [birth year]   Cancelled Treatment:    Reason Eval/Treat Not Completed: Other (comment).  Attempted skilled PT/OT treatment session with pt. Several attempts to arouse for participation.  Pt. With eyes closed in deep sleep awoke briefly to state she had just been given anxiety medication and was too sleepy for participation then closed eyes again.  Reviewed we would check back as able.  Pt. And visitor that was in the room verbalized understanding.    Alessandra Bevels Lorraine-COTA/L 12/30/2023, 10:25 AM
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
## 2023-12-31 LAB — CBC
HCT: 32.4 % — ABNORMAL LOW (ref 36.0–46.0)
Hemoglobin: 10.7 g/dL — ABNORMAL LOW (ref 12.0–15.0)
MCH: 29.6 pg (ref 26.0–34.0)
MCHC: 33 g/dL (ref 30.0–36.0)
MCV: 89.5 fL (ref 80.0–100.0)
Platelets: 188 10*3/uL (ref 150–400)
RBC: 3.62 MIL/uL — ABNORMAL LOW (ref 3.87–5.11)
RDW: 14.8 % (ref 11.5–15.5)
WBC: 7.9 10*3/uL (ref 4.0–10.5)
nRBC: 0 % (ref 0.0–0.2)

## 2023-12-31 MED ORDER — ACETAMINOPHEN 500 MG PO TABS: 1000.0000 mg | ORAL_TABLET | Freq: Four times a day (QID) | ORAL | Status: DC | PRN

## 2023-12-31 MED ORDER — HYDROXYZINE HCL 25 MG PO TABS: 25.0000 mg | ORAL_TABLET | Freq: Three times a day (TID) | ORAL | 0 refills | Status: AC | PRN

## 2023-12-31 MED ORDER — GABAPENTIN 300 MG PO CAPS: 300.0000 mg | ORAL_CAPSULE | Freq: Three times a day (TID) | ORAL | 0 refills | Status: DC

## 2023-12-31 MED ORDER — LIDOCAINE 5 % EX PTCH: 2.0000 | MEDICATED_PATCH | Freq: Every day | CUTANEOUS | 0 refills | Status: AC

## 2023-12-31 MED ORDER — METHOCARBAMOL 1000 MG PO TABS: 1000.0000 mg | ORAL_TABLET | Freq: Four times a day (QID) | ORAL | 0 refills | Status: AC | PRN

## 2023-12-31 MED ORDER — OXYCODONE HCL 5 MG PO TABS
5.0000 mg | ORAL_TABLET | ORAL | 0 refills | Status: AC | PRN
Start: 2023-12-31 — End: 2024-01-05
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
## 2023-12-31 NOTE — Plan of Care (Signed)

## 2024-01-01 DIAGNOSIS — F250 Schizoaffective disorder, bipolar type: Secondary | ICD-10-CM | POA: Diagnosis not present

## 2024-01-01 DIAGNOSIS — S31634A Puncture wound without foreign body of abdominal wall, left lower quadrant with penetration into peritoneal cavity, initial encounter: Secondary | ICD-10-CM | POA: Diagnosis not present

## 2024-01-01 DIAGNOSIS — Z6841 Body Mass Index (BMI) 40.0 and over, adult: Secondary | ICD-10-CM | POA: Diagnosis not present

## 2024-01-01 DIAGNOSIS — Z23 Encounter for immunization: Secondary | ICD-10-CM | POA: Diagnosis not present

## 2024-01-01 MED ORDER — FAMOTIDINE 20 MG PO TABS
20.0000 mg | ORAL_TABLET | Freq: Two times a day (BID) | ORAL | Status: DC
  Administered 2024-01-01 (×2): 20 mg via ORAL
  Filled 2024-01-01 (×2): qty 1

## 2024-01-01 NOTE — Progress Notes (Signed)
 Contacted at 1:19 AM by patient's nurse noting the patient was complaining of chest pain/pressure which have been going on since yesterday. Vital signs-temperature 98, heart rate 81, blood pressure 137/83, respirations 18, saturations of 100% on room air.  Patient is on several scheduled pain medications and per bedside nurse and charge nurse, patient had already refused anxiety medications or additional pain medications at this time.  -Advised patient needs to do incentive spirometry which she has apparently also been refusing, order changed to every hour when awake -EKG ordered -Pepcid added in case there is a GI component; noted that she is on scheduled ibuprofen 600 mg 4 times daily without any GI prophylaxis
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
## 2024-01-01 NOTE — TOC CAGE-AID Note (Signed)
 Transition of Care [facility]) - CAGE-AID Screening   Patient Details  Name: Erin Hernandez MRN: [id number] Date of Birth: 28-Mar-1991  Transition of Care [facility]) CM/SW Contact:    Judie Bonus, RN Phone Number: 01/01/2024, 12:46 PM   Clinical Narrative:  Pt denies tobacco,vape usage Pt denies elicit substance usage Pt reports etoh 1-2 x week. Denies needing resources at this time.   CAGE-AID Screening:    Have You Ever Felt You Ought to Cut Down on Your Drinking or Drug Use?: No Have People Annoyed You By Critizing Your Drinking Or Drug Use?: No Have You Felt Bad Or Guilty About Your Drinking Or Drug Use?: No Have You Ever Had a Drink or Used Drugs First Thing In The Morning to Steady Your Nerves or to Get Rid of a Hangover?: No CAGE-AID Score: 0  Substance Abuse Education Offered: No

## 2024-01-01 NOTE — Progress Notes (Signed)
 Physical Therapy Discharge Patient Details Name: Erin Hernandez MRN: [id number] DOB: [birth year] Today's Date: 01/01/2024 Time: 4098-1191 PT Time Calculation (min) (ACUTE ONLY): 8 min  Patient discharged from PT services secondary to goals met and no further PT needs identified.  Please see latest therapy progress note for current level of functioning and progress toward goals.    Progress and discharge plan discussed with patient and/or caregiver: Patient/Caregiver agrees with plan  GP     Bevelyn Buckles 01/01/2024, 10:15 AM  Spyridon Hornstein M,PT Acute Rehab Services [phone]
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
## 2024-01-02 ENCOUNTER — Encounter: Payer: Self-pay | Admitting: Physician Assistant

## 2024-01-05 ENCOUNTER — Encounter: Payer: Self-pay | Admitting: Physician Assistant

## 2024-01-05 ENCOUNTER — Ambulatory Visit: Payer: MEDICAID | Admitting: Physician Assistant

## 2024-01-05 DIAGNOSIS — F250 Schizoaffective disorder, bipolar type: Secondary | ICD-10-CM | POA: Diagnosis not present

## 2024-01-05 DIAGNOSIS — S21339D Puncture wound without foreign body of unspecified front wall of thorax with penetration into thoracic cavity, subsequent encounter: Secondary | ICD-10-CM | POA: Diagnosis not present

## 2024-01-05 MED ORDER — PREGABALIN 75 MG PO CAPS: 75.0000 mg | ORAL_CAPSULE | Freq: Two times a day (BID) | ORAL | 0 refills | Status: DC

## 2024-01-05 MED ORDER — MELOXICAM 7.5 MG PO TABS: 7.5000 mg | ORAL_TABLET | Freq: Every day | ORAL | 0 refills | Status: DC

## 2024-01-05 MED ORDER — CEPHALEXIN 500 MG PO CAPS: 500.0000 mg | ORAL_CAPSULE | Freq: Two times a day (BID) | ORAL | 0 refills | Status: DC
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
## 2024-01-09 ENCOUNTER — Ambulatory Visit: Payer: MEDICAID | Attending: General Surgery | Admitting: Physical Therapy

## 2024-01-09 DIAGNOSIS — S21339A Puncture wound without foreign body of unspecified front wall of thorax with penetration into thoracic cavity, initial encounter: Secondary | ICD-10-CM | POA: Insufficient documentation

## 2024-01-29 ENCOUNTER — Ambulatory Visit: Payer: MEDICAID | Admitting: Physician Assistant

## 2024-01-29 NOTE — Progress Notes (Deleted)
 Established patient visit  Patient: Erin Hernandez   DOB: [birth year]   32 y.o. Female  MRN: [id number] Visit Date: 01/29/2024  Today's healthcare provider: Blane Bunting, PA-C   No chief complaint on file.  Subjective       Discussed the use of AI scribe software for clinical note transcription with the patient, who gave verbal consent to proceed.  History of Present Illness        01/05/2024    2:57 PM 09/08/2023   11:11 AM  Depression screen PHQ 2/9  Decreased Interest 3 2  Down, Depressed, Hopeless 3 1  PHQ - 2 Score 6 3  Altered sleeping 3 3  Tired, decreased energy 3 3  Change in appetite 3 3  Feeling bad or failure about yourself  2 1  Trouble concentrating 2 0  Moving slowly or fidgety/restless 1 0  Suicidal thoughts 1 0  PHQ-9 Score 21 13  Difficult doing work/chores Extremely dIfficult Somewhat difficult      01/05/2024    2:58 PM 09/08/2023   11:11 AM  GAD 7 : Generalized Anxiety Score  Nervous, Anxious, on Edge 3 2  Control/stop worrying 3 1  Worry too much - different things 3 3  Trouble relaxing 3 1  Restless 2 0  Easily annoyed or irritable 3 1  Afraid - awful might happen 3 1  Total GAD 7 Score 20 9  Anxiety Difficulty Extremely difficult Somewhat difficult    Medications: Outpatient Medications Prior to Visit  Medication Sig   acetaminophen  (TYLENOL ) 500 MG tablet Take 2 tablets (1,000 mg total) by mouth every 6 (six) hours as needed.   celecoxib  (CELEBREX ) 200 MG capsule Take 1 capsule (200 mg total) by mouth 2 (two) times daily.   celecoxib  (CELEBREX ) 200 MG capsule Take 200 mg by mouth 2 (two) times daily.   cephALEXin  (KEFLEX ) 500 MG capsule Take 1 capsule (500 mg total) by mouth 2 (two) times daily.   cetirizine  (ZYRTEC ) 10 MG tablet Take 1 tablet (10 mg total) by mouth daily.   cetirizine  (ZYRTEC ) 10 MG tablet Take 10 mg by mouth daily.   fluticasone  (FLONASE ) 50 MCG/ACT nasal spray Place 2 sprays into both nostrils daily.    fluticasone  (FLONASE ) 50 MCG/ACT nasal spray Place 2 sprays into both nostrils daily.   gabapentin  (NEURONTIN ) 300 MG capsule Take 1 capsule (300 mg total) by mouth 3 (three) times daily for 5 days.   ibuprofen  (ADVIL ) 800 MG tablet Take 800 mg by mouth every 6 (six) hours as needed for mild pain (pain score 1-3).   ibuprofen  (ADVIL ) 800 MG tablet Take 800 mg by mouth every 6 (six) hours as needed for mild pain (pain score 1-3).   meloxicam  (MOBIC ) 15 MG tablet Take 15 mg by mouth daily.   meloxicam  (MOBIC ) 7.5 MG tablet Take 1 tablet (7.5 mg total) by mouth daily.   omeprazole  (PRILOSEC) 40 MG capsule Take 1 capsule (40 mg total) by mouth daily.   omeprazole  (PRILOSEC) 40 MG capsule Take 40 mg by mouth daily.   pregabalin  (LYRICA ) 75 MG capsule Take 1 capsule (75 mg total) by mouth 2 (two) times daily.   No facility-administered medications prior to visit.    Review of Systems All negative Except see HPI   {Insert previous labs (optional):23779} {See past labs  Heme  Chem  Endocrine  Serology  Results Review (optional):1}   Objective    There were no vitals taken for this visit. {Insert last  BP/Wt (optional):23777}{See vitals history (optional):1}   Physical Exam   No results found for any visits on 01/29/24.      Assessment and Plan Assessment & Plan     No orders of the defined types were placed in this encounter.   No follow-ups on file.   The patient was advised to call back or seek an in-person evaluation if the symptoms worsen or if the condition fails to improve as anticipated.  I discussed the assessment and treatment plan with the patient. The patient was provided an opportunity to ask questions and all were answered. The patient agreed with the plan and demonstrated an understanding of the instructions.  I, Ozelle Brubacher, PA-C have reviewed all documentation for this visit. The documentation on 01/29/2024  for the exam, diagnosis, procedures, and orders are  all accurate and complete.  Blane Bunting, [facility], MMS [facility] [phone] (phone) [phone] (fax)  [facility]

## 2024-02-01 ENCOUNTER — Ambulatory Visit: Payer: MEDICAID | Admitting: Physician Assistant

## 2024-02-07 ENCOUNTER — Ambulatory Visit: Payer: MEDICAID | Admitting: Physician Assistant

## 2024-02-09 ENCOUNTER — Ambulatory Visit: Payer: MEDICAID | Admitting: Physician Assistant

## 2024-03-04 ENCOUNTER — Ambulatory Visit: Payer: MEDICAID | Admitting: Physician Assistant

## 2024-04-01 ENCOUNTER — Encounter: Payer: MEDICAID | Attending: Physician Assistant | Admitting: Physician Assistant

## 2024-04-01 DIAGNOSIS — F17218 Nicotine dependence, cigarettes, with other nicotine-induced disorders: Secondary | ICD-10-CM | POA: Insufficient documentation

## 2024-04-01 DIAGNOSIS — L988 Other specified disorders of the skin and subcutaneous tissue: Secondary | ICD-10-CM | POA: Insufficient documentation

## 2024-07-03 ENCOUNTER — Encounter: Payer: Self-pay | Admitting: Physician Assistant

## 2024-07-03 ENCOUNTER — Ambulatory Visit (INDEPENDENT_AMBULATORY_CARE_PROVIDER_SITE_OTHER): Payer: MEDICAID | Admitting: Physician Assistant

## 2024-07-03 DIAGNOSIS — Z23 Encounter for immunization: Secondary | ICD-10-CM

## 2024-07-03 DIAGNOSIS — F250 Schizoaffective disorder, bipolar type: Secondary | ICD-10-CM | POA: Diagnosis not present

## 2024-07-03 DIAGNOSIS — F411 Generalized anxiety disorder: Secondary | ICD-10-CM

## 2024-07-03 DIAGNOSIS — Y249XXA Unspecified firearm discharge, undetermined intent, initial encounter: Secondary | ICD-10-CM

## 2024-07-03 DIAGNOSIS — F319 Bipolar disorder, unspecified: Secondary | ICD-10-CM | POA: Diagnosis not present

## 2024-07-03 DIAGNOSIS — O161 Unspecified maternal hypertension, first trimester: Secondary | ICD-10-CM | POA: Insufficient documentation
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
## 2024-07-03 NOTE — Progress Notes (Signed)
 Patient is in office today for a nurse visit for Immunization. Patient Injection was given in the  Right deltoid. Patient tolerated injection well.

## 2024-07-04 ENCOUNTER — Telehealth: Payer: Self-pay

## 2024-07-04 ENCOUNTER — Other Ambulatory Visit: Payer: Self-pay | Admitting: Physician Assistant

## 2024-07-04 ENCOUNTER — Ambulatory Visit: Payer: Self-pay | Admitting: Physician Assistant

## 2024-07-04 DIAGNOSIS — E119 Type 2 diabetes mellitus without complications: Secondary | ICD-10-CM

## 2024-07-04 LAB — CBC WITH DIFFERENTIAL/PLATELET
Basophils Absolute: 0.1 x10E3/uL (ref 0.0–0.2)
Basos: 1 %
EOS (ABSOLUTE): 0.1 x10E3/uL (ref 0.0–0.4)
Eos: 1 %
Hematocrit: 37.7 % (ref 34.0–46.6)
Hemoglobin: 12.2 g/dL (ref 11.1–15.9)
Immature Grans (Abs): 0 x10E3/uL (ref 0.0–0.1)
Immature Granulocytes: 0 %
Lymphocytes Absolute: 3.1 x10E3/uL (ref 0.7–3.1)
Lymphs: 32 %
MCH: 28.2 pg (ref 26.6–33.0)
MCHC: 32.4 g/dL (ref 31.5–35.7)
MCV: 87 fL (ref 79–97)
Monocytes Absolute: 0.8 x10E3/uL (ref 0.1–0.9)
Monocytes: 8 %
Neutrophils Absolute: 5.5 x10E3/uL (ref 1.4–7.0)
Neutrophils: 58 %
Platelets: 264 x10E3/uL (ref 150–450)
RBC: 4.32 x10E6/uL (ref 3.77–5.28)
RDW: 16.7 % — ABNORMAL HIGH (ref 11.7–15.4)
WBC: 9.5 x10E3/uL (ref 3.4–10.8)

## 2024-07-04 LAB — COMPREHENSIVE METABOLIC PANEL WITH GFR
ALT: 15 IU/L (ref 0–32)
AST: 15 IU/L (ref 0–40)
Albumin: 4.1 g/dL (ref 3.9–4.9)
Alkaline Phosphatase: 77 IU/L (ref 41–116)
BUN/Creatinine Ratio: 10 (ref 9–23)
BUN: 7 mg/dL (ref 6–20)
Bilirubin Total: 0.3 mg/dL (ref 0.0–1.2)
CO2: 18 mmol/L — ABNORMAL LOW (ref 20–29)
Calcium: 9.3 mg/dL (ref 8.7–10.2)
Chloride: 102 mmol/L (ref 96–106)
Creatinine, Ser: 0.7 mg/dL (ref 0.57–1.00)
Globulin, Total: 2.5 g/dL (ref 1.5–4.5)
Glucose: 96 mg/dL (ref 70–99)
Potassium: 4.5 mmol/L (ref 3.5–5.2)
Sodium: 139 mmol/L (ref 134–144)
Total Protein: 6.6 g/dL (ref 6.0–8.5)
eGFR: 118 mL/min/1.73 (ref >59)

## 2024-07-04 LAB — TSH: TSH: 0.888 u[IU]/mL (ref 0.450–4.500)

## 2024-07-04 LAB — HEMOGLOBIN A1C
Est. average glucose Bld gHb Est-mCnc: 143 mg/dL
Hgb A1c MFr Bld: 6.6 % — ABNORMAL HIGH (ref 4.8–5.6)

## 2024-07-04 LAB — LIPID PANEL
Chol/HDL Ratio: 4.6 ratio — ABNORMAL HIGH (ref 0.0–4.4)
Cholesterol, Total: 158 mg/dL (ref 100–199)
HDL: 34 mg/dL — ABNORMAL LOW (ref >39)
LDL Chol Calc (NIH): 96 mg/dL (ref 0–99)
Triglycerides: 159 mg/dL — ABNORMAL HIGH (ref 0–149)
VLDL Cholesterol Cal: 28 mg/dL (ref 5–40)

## 2024-07-04 MED ORDER — METFORMIN HCL ER 500 MG PO TB24: 500.0000 mg | ORAL_TABLET | Freq: Every day | ORAL | 0 refills | Status: AC

## 2024-07-04 MED ORDER — OZEMPIC (0.25 OR 0.5 MG/DOSE) 2 MG/3ML ~~LOC~~ SOPN: 0.2500 mg | PEN_INJECTOR | SUBCUTANEOUS | 1 refills | Status: AC

## 2024-07-04 NOTE — Telephone Encounter (Signed)
 Pt called and stated that she called her ins and they are needing a prior authorization for the semiglutide. Please send in request to her ins co. Please advise pt if further questions are needed.

## 2024-07-04 NOTE — Telephone Encounter (Signed)
 If pt will be approved for ozempic, the following things should be considered:  Ozempic (semaglutide) is contraindicated in patients with a personal or family history of medullary thyroid carcinoma (MTC) or multiple endocrine neoplasia syndrome type 2 (MEN 2), those with a history of serious hypersensitivity reactions to the drug, and in individuals with severe gastroparesis   Monitoring Ozempic (semaglutide) in early diabetic patients involves tracking blood glucose, HbA1c, weight, and potential adverse effects, including hypoglycemia and diabetic retinopathy.   It might take some time before ozempic will be approved by her insurance. She needs to start taking metformin.

## 2024-07-04 NOTE — Telephone Encounter (Signed)
 Two encounter messages regarding same question plus additional questions. Pt was advised regarding metformin but pt stating since she has DM now she can have the shot medication to loose weight, stated provider has to call to speak with them to discuss what medication will be covered. I advised pt we generally do not call, insurance should let pt know what would be covered but stated they did not want to give her information. Pt is firmly asking to get on shots to loose weight since being recently diagnosed with diabetes.

## 2024-07-04 NOTE — Telephone Encounter (Signed)
 Copied from CRM #[id number]. Topic: Clinical - Prescription Issue >> Jul 04, 2024  9:20 AM Deaijah H wrote: Reason for CRM: Patient called in stating pharmacy Could not approve refill and to contact provider. Insurance advised patient needs to call provider number and talk to them regarding weight loss shots and gastric sleeve. Also stated metformin is not being covered by insurance now.

## 2024-07-04 NOTE — Telephone Encounter (Unsigned)
 Copied from CRM #[id number]. Topic: Clinical - Prescription Issue >> Jul 04, 2024  9:20 AM Deaijah H wrote: Reason for CRM: Patient called in stating pharmacy Could not approve refill and to contact provider. Insurance advised patient needs to call provider number and talk to them regarding weight loss shots and gastric sleeve. Also stated metformin is not being covered by insurance now. >> Jul 04, 2024 12:07 PM Antwanette L wrote: The patient called requesting that Janna Ostwalt, PA contact [facility] to explain the medical necessity for weight loss injections and a gastric sleeve procedure.[facility]'s provider contact number is: [phone].

## 2024-07-05 ENCOUNTER — Telehealth: Payer: Self-pay

## 2024-07-05 ENCOUNTER — Other Ambulatory Visit (HOSPITAL_COMMUNITY): Payer: Self-pay

## 2024-07-05 NOTE — Telephone Encounter (Signed)
 PA has been submitted and is currently pending, thank you.

## 2024-07-05 NOTE — Telephone Encounter (Signed)
 PA has been approved

## 2024-07-05 NOTE — Telephone Encounter (Signed)
 Noted, thank you!

## 2024-07-05 NOTE — Telephone Encounter (Signed)
 Pharmacy Patient Advocate Encounter   Received notification from Physician's Office that prior authorization for Ozempic (0.25 or 0.5 MG/DOSE) 2MG /3ML pen-injectors is required/requested.   Insurance verification completed.   The patient is insured through [facility] MEDICAID.   Per test claim: PA required; PA submitted to above mentioned insurance via Latent Key/confirmation #/EOC [id number] Status is pending

## 2024-07-05 NOTE — Telephone Encounter (Signed)
 Noted

## 2024-07-05 NOTE — Telephone Encounter (Signed)
 Pharmacy Patient Advocate Encounter  Received notification from [facility] MEDICAID that Prior Authorization for Ozempic (0.25 or 0.5 MG/DOSE) 2MG /3ML pen-injectors  has been APPROVED from 07/05/24 to 07/05/25. Ran test claim, Copay is $4. This test claim was processed through [facility]- copay amounts may vary at other pharmacies due to pharmacy/plan contracts, or as the patient moves through the different stages of their insurance plan.   PA #/Case ID/Reference #: [id number]

## 2024-07-18 ENCOUNTER — Encounter: Payer: Self-pay | Admitting: Physician Assistant

## 2024-07-18 ENCOUNTER — Ambulatory Visit (INDEPENDENT_AMBULATORY_CARE_PROVIDER_SITE_OTHER): Payer: MEDICAID | Admitting: Physician Assistant

## 2024-07-18 VITALS — BP 141/99 | HR 78 | Resp 14 | Ht 65.0 in | Wt 277.6 lb

## 2024-07-18 DIAGNOSIS — I152 Hypertension secondary to endocrine disorders: Secondary | ICD-10-CM | POA: Diagnosis not present

## 2024-07-18 DIAGNOSIS — E118 Type 2 diabetes mellitus with unspecified complications: Secondary | ICD-10-CM | POA: Diagnosis not present

## 2024-07-18 DIAGNOSIS — E1159 Type 2 diabetes mellitus with other circulatory complications: Secondary | ICD-10-CM

## 2024-07-18 DIAGNOSIS — E049 Nontoxic goiter, unspecified: Secondary | ICD-10-CM | POA: Diagnosis not present

## 2024-07-18 DIAGNOSIS — Z1159 Encounter for screening for other viral diseases: Secondary | ICD-10-CM

## 2024-07-18 MED ORDER — LANCET DEVICE MISC: 1.0000 | 0 refills | Status: AC

## 2024-07-18 MED ORDER — BLOOD GLUCOSE TEST VI STRP
1.0000 | ORAL_STRIP | 0 refills | Status: AC
Start: 2024-07-18 — End: ?

## 2024-07-18 MED ORDER — BLOOD GLUCOSE MONITORING SUPPL DEVI: 1.0000 | 0 refills | Status: AC

## 2024-07-18 MED ORDER — LANCETS MISC: 1.0000 | 0 refills | Status: AC

## 2024-07-18 MED ORDER — AMLODIPINE BESYLATE 2.5 MG PO TABS: 2.5000 mg | ORAL_TABLET | Freq: Every day | ORAL | 0 refills | Status: AC

## 2024-07-18 NOTE — Progress Notes (Signed)
 Established patient visit  Patient: Erin Hernandez   DOB: 1991/09/15   32 y.o. Female  MRN: [id number] Visit Date: 07/18/2024  Today's healthcare provider: Jolynn Spencer, PA-C   Chief Complaint  Patient presents with   Hypertension    Not monitoring regularly   Weight Loss    Pt started ozempic - makes her feel sick at times overall tolerating well Pt states she was wanting Mounjaro medication not ozempic   Diabetes    Pt did not start metformin medication thinking ozempic will treat her DM.    Subjective     HPI     Hypertension    Additional comments: Not monitoring regularly        Weight Loss    Additional comments: Pt started ozempic - makes her feel sick at times overall tolerating well Pt states she was wanting Mounjaro medication not ozempic        Diabetes    Additional comments: Pt did not start metformin medication thinking ozempic will treat her DM.       Last edited by Wilfred Hargis RAMAN, CMA on 07/18/2024  9:26 AM.       Discussed the use of AI scribe software for clinical note transcription with the patient, who gave verbal consent to proceed.  History of Present Illness Erin Hernandez is a 33 year old female with diabetes and hypertension who presents for follow-up on weight management and medication review.  She is on a diet and exercise regimen and was previously referred for bariatric surgery. She is taking Ozempic 0.25 mg, which initially suppressed her appetite, but her appetite has returned. She experiences nausea when eating after taking the medication.  Her A1c has recently reached the diabetic range, and she is not on metformin. She is considering using a glucometer to monitor her blood sugar levels.  She has consistently high blood pressure readings and has not been regularly monitoring her blood pressure at home. Working two jobs affects her ability to manage her health consistently.  There is a family history of thyroid issues and  cancer on her father's side. She previously had a salivary gland removed due to swelling, with no current symptoms such as a lump in her throat or changes in saliva production. No cold or heat intolerance.  She is supposed to wear glasses but has not found an eye doctor covered by her insurance.       01/05/2024    2:57 PM 09/08/2023   11:11 AM  Depression screen PHQ 2/9  Decreased Interest 3 2  Down, Depressed, Hopeless 3 1  PHQ - 2 Score 6 3  Altered sleeping 3 3  Tired, decreased energy 3 3  Change in appetite 3 3  Feeling bad or failure about yourself  2 1  Trouble concentrating 2 0  Moving slowly or fidgety/restless 1 0  Suicidal thoughts 1 0  PHQ-9 Score 21 13  Difficult doing work/chores Extremely dIfficult Somewhat difficult      01/05/2024    2:58 PM 09/08/2023   11:11 AM  GAD 7 : Generalized Anxiety Score  Nervous, Anxious, on Edge 3 2  Control/stop worrying 3 1  Worry too much - different things 3 3  Trouble relaxing 3 1  Restless 2 0  Easily annoyed or irritable 3 1  Afraid - awful might happen 3 1  Total GAD 7 Score 20 9  Anxiety Difficulty Extremely difficult Somewhat difficult    Medications: Outpatient Medications Prior to Visit  Medication Sig   Semaglutide,0.25 or 0.5MG /DOS, (OZEMPIC, 0.25 OR 0.5 MG/DOSE,) 2 MG/3ML SOPN Inject 0.25 mg into the skin once a week.   metFORMIN (GLUCOPHAGE-XR) 500 MG 24 hr tablet Take 1 tablet (500 mg total) by mouth daily with breakfast. (Patient not taking: Reported on 07/18/2024)   No facility-administered medications prior to visit.    Review of Systems All negative Except see HPI       Objective    BP (!) 141/99   Pulse 78   Resp 14   Ht 5' 5 (1.651 m)   Wt 277 lb 9.6 oz (125.9 kg)   LMP 06/30/2024   SpO2 99%   BMI 46.20 kg/m     Physical Exam Vitals reviewed.  Constitutional:      General: She is not in acute distress.    Appearance: Normal appearance. She is well-developed. She is not  diaphoretic.  HENT:     Head: Normocephalic and atraumatic.  Eyes:     General: No scleral icterus.    Conjunctiva/sclera: Conjunctivae normal.  Neck:     Thyroid: No thyromegaly.  Cardiovascular:     Rate and Rhythm: Normal rate and regular rhythm.     Pulses: Normal pulses.     Heart sounds: Normal heart sounds. No murmur heard. Pulmonary:     Effort: Pulmonary effort is normal. No respiratory distress.     Breath sounds: Normal breath sounds. No wheezing, rhonchi or rales.  Musculoskeletal:     Cervical back: Neck supple.     Right lower leg: No edema.     Left lower leg: No edema.  Lymphadenopathy:     Cervical: No cervical adenopathy.  Skin:    General: Skin is warm and dry.     Findings: No rash.  Neurological:     Mental Status: She is alert and oriented to person, place, and time. Mental status is at baseline.  Psychiatric:        Mood and Affect: Mood normal.        Behavior: Behavior normal.      No results found for any visits on 07/18/24.      Assessment & Plan Obesity and Type 2 Diabetes Mellitus Diabetes managed with Ozempic; appetite suppression initially effective but has waned. Current dose may be inadequate. - Increase Ozempic to 0.5 mg. - Prescribe glucometer for blood glucose monitoring. - Advise low-carb diet and adequate hydration. - Emphasize foot care and regular monitoring for changes. - Schedule follow-up for A1c in three months. Contact use regarding a referral to ophthalmology/depending on insurance coverage Will follow-up  Hypertension New diagnosis Blood pressure elevated with inconsistent monitoring. Plan to initiate low-dose antihypertensive medication. - Start antihypertensive medication at a low dose. - Monitor for leg swelling. - Advise drinking 8-12 glasses of water daily. - Schedule follow-up in six weeks to assess blood pressure control.  Goiter (Enlarged Thyroid) Concern for thyroid enlargement due to family history. No  current symptoms of dysfunction. - Order thyroid ultrasound. - Monitor for symptoms such as lump in throat, pain in upper abdomen, or changes in swallowing or breathing. - Check free T4 levels.  Hypertension associated with diabetes (HCC) (Primary)  - Urine Microalbumin w/creat. ratio - Hepatitis C antibody - amLODipine (NORVASC) 2.5 MG tablet; Take 1 tablet (2.5 mg total) by mouth daily.  Dispense: 90 tablet; Refill: 0  Controlled diabetes mellitus type 2 with complications (HCC)  - Urine Microalbumin w/creat. ratio - Blood Glucose Monitoring Suppl DEVI; 1 each  by Does not apply route as directed. Dispense based on patient and insurance preference. Use up to four times daily as directed. (FOR ICD-10 E10.9, E11.9).  Dispense: 1 each; Refill: 0 - Glucose Blood (BLOOD GLUCOSE TEST STRIPS) STRP; 1 each by Does not apply route as directed. Dispense based on patient and insurance preference. Use up to four times daily as directed. (FOR ICD-10 E10.9, E11.9).  Dispense: 100 strip; Refill: 0 - Lancet Device MISC; 1 each by Does not apply route as directed. Dispense based on patient and insurance preference. Use up to four times daily as directed. (FOR ICD-10 E10.9, E11.9).  Dispense: 1 each; Refill: 0 - Lancets MISC; 1 each by Does not apply route as directed. Dispense based on patient and insurance preference. Use up to four times daily as directed. (FOR ICD-10 E10.9, E11.9).  Dispense: 100 each; Refill: 0  Encounter for hepatitis C screening test for low risk patient Hep c  Goiter  - US  THYROID; Future - T4, free  Obesity Chronic and improving on ozempic, associated with DM and HTN Continue lifestyle modifications Will follow-up   Orders Placed This Encounter  Procedures   US  THYROID    Standing Status:   Future    Expiration Date:   07/18/2025    Reason for Exam (SYMPTOM  OR DIAGNOSIS REQUIRED):   goiter    Preferred imaging location?:   [facility]   Urine Microalbumin  w/creat. ratio   Hepatitis C antibody   T4, free    Return in about 6 weeks (around 08/29/2024) for BP f/u, chronic disease f/u.   The patient was advised to call back or seek an in-person evaluation if the symptoms worsen or if the condition fails to improve as anticipated.  I discussed the assessment and treatment plan with the patient. The patient was provided an opportunity to ask questions and all were answered. The patient agreed with the plan and demonstrated an understanding of the instructions.  I, Tyerra Loretto, PA-C have reviewed all documentation for this visit. The documentation on 07/18/2024  for the exam, diagnosis, procedures, and orders are all accurate and complete.  Jolynn Spencer, [facility], MMS [facility] [phone] (phone) [phone] (fax)  [facility]

## 2024-07-19 LAB — MICROALBUMIN / CREATININE URINE RATIO
Creatinine, Urine: 182.5 mg/dL
Microalb/Creat Ratio: 3 mg/g{creat} (ref 0–29)
Microalbumin, Urine: 5.2 ug/mL

## 2024-07-19 LAB — T4, FREE: Free T4: 1.14 ng/dL (ref 0.82–1.77)

## 2024-07-19 LAB — HEPATITIS C ANTIBODY: Hep C Virus Ab: NONREACTIVE

## 2024-07-23 ENCOUNTER — Ambulatory Visit: Payer: Self-pay | Admitting: Physician Assistant

## 2024-07-25 ENCOUNTER — Ambulatory Visit (HOSPITAL_COMMUNITY)
Admission: RE | Admit: 2024-07-25 | Discharge: 2024-07-25 | Disposition: A | Discharge status: Home / Self Care | Payer: MEDICAID | Source: Ambulatory Visit | Attending: Physician Assistant | Admitting: Physician Assistant

## 2024-07-25 DIAGNOSIS — E049 Nontoxic goiter, unspecified: Secondary | ICD-10-CM | POA: Insufficient documentation

## 2024-07-29 ENCOUNTER — Encounter: Payer: Self-pay | Admitting: Radiology

## 2024-08-23 NOTE — Progress Notes (Deleted)
 Established patient visit  Patient: Erin Hernandez   DOB: [birth year]   33 y.o. Female  MRN: [id number] Visit Date: 08/29/2024  Today's healthcare provider: Jolynn Spencer, PA-C   No chief complaint on file.  Subjective       Discussed the use of AI scribe software for clinical note transcription with the patient, who gave verbal consent to proceed.  History of Present Illness        01/05/2024    2:57 PM 09/08/2023   11:11 AM  Depression screen PHQ 2/9  Decreased Interest 3 2  Down, Depressed, Hopeless 3 1  PHQ - 2 Score 6 3  Altered sleeping 3 3  Tired, decreased energy 3 3  Change in appetite 3 3  Feeling bad or failure about yourself  2 1  Trouble concentrating 2 0  Moving slowly or fidgety/restless 1 0  Suicidal thoughts 1 0  PHQ-9 Score 21  13   Difficult doing work/chores Extremely dIfficult Somewhat difficult     Data saved with a previous flowsheet row definition      01/05/2024    2:58 PM 09/08/2023   11:11 AM  GAD 7 : Generalized Anxiety Score  Nervous, Anxious, on Edge 3 2  Control/stop worrying 3 1  Worry too much - different things 3 3  Trouble relaxing 3 1  Restless 2 0  Easily annoyed or irritable 3 1  Afraid - awful might happen 3 1  Total GAD 7 Score 20 9  Anxiety Difficulty Extremely difficult Somewhat difficult    Medications: Outpatient Medications Prior to Visit  Medication Sig  . amLODipine  (NORVASC ) 2.5 MG tablet Take 1 tablet (2.5 mg total) by mouth daily.  . Blood Glucose Monitoring Suppl DEVI 1 each by Does not apply route as directed. Dispense based on patient and insurance preference. Use up to four times daily as directed. (FOR ICD-10 E10.9, E11.9).  . Glucose Blood (BLOOD GLUCOSE TEST STRIPS) STRP 1 each by Does not apply route as directed. Dispense based on patient and insurance preference. Use up to four times daily as directed. (FOR ICD-10 E10.9, E11.9).  SABRA Ora Device MISC 1 each by Does not apply route as directed.  Dispense based on patient and insurance preference. Use up to four times daily as directed. (FOR ICD-10 E10.9, E11.9).  . Lancets MISC 1 each by Does not apply route as directed. Dispense based on patient and insurance preference. Use up to four times daily as directed. (FOR ICD-10 E10.9, E11.9).  . metFORMIN  (GLUCOPHAGE -XR) 500 MG 24 hr tablet Take 1 tablet (500 mg total) by mouth daily with breakfast. (Patient not taking: Reported on 07/18/2024)  . Semaglutide ,0.25 or 0.5MG /DOS, (OZEMPIC , 0.25 OR 0.5 MG/DOSE,) 2 MG/3ML SOPN Inject 0.25 mg into the skin once a week.   No facility-administered medications prior to visit.    Review of Systems  All other systems reviewed and are negative.  All negative Except see HPI   {Insert previous labs (optional):23779} {See past labs  Heme  Chem  Endocrine  Serology  Results Review (optional):1}   Objective    There were no vitals taken for this visit. {Insert last BP/Wt (optional):23777}{See vitals history (optional):1}   Physical Exam Vitals reviewed.  Constitutional:      General: She is not in acute distress.    Appearance: Normal appearance. She is well-developed. She is not diaphoretic.  HENT:     Head: Normocephalic and atraumatic.  Eyes:     General: No scleral  icterus.    Conjunctiva/sclera: Conjunctivae normal.  Neck:     Thyroid : No thyromegaly.  Cardiovascular:     Rate and Rhythm: Normal rate and regular rhythm.     Pulses: Normal pulses.     Heart sounds: Normal heart sounds. No murmur heard. Pulmonary:     Effort: Pulmonary effort is normal. No respiratory distress.     Breath sounds: Normal breath sounds. No wheezing, rhonchi or rales.  Musculoskeletal:     Cervical back: Neck supple.     Right lower leg: No edema.     Left lower leg: No edema.  Lymphadenopathy:     Cervical: No cervical adenopathy.  Skin:    General: Skin is warm and dry.     Findings: No rash.  Neurological:     Mental Status: She is alert  and oriented to person, place, and time. Mental status is at baseline.  Psychiatric:        Mood and Affect: Mood normal.        Behavior: Behavior normal.     No results found for any visits on 08/29/24.      Assessment and Plan Assessment & Plan     No orders of the defined types were placed in this encounter.   No follow-ups on file.   The patient was advised to call back or seek an in-person evaluation if the symptoms worsen or if the condition fails to improve as anticipated.  I discussed the assessment and treatment plan with the patient. The patient was provided an opportunity to ask questions and all were answered. The patient agreed with the plan and demonstrated an understanding of the instructions.  I, Octave Montrose, PA-C have reviewed all documentation for this visit. The documentation on 08/29/2024  for the exam, diagnosis, procedures, and orders are all accurate and complete.  Jolynn Spencer, [facility], MMS [facility] [phone] (phone) [phone] (fax)  [facility]

## 2024-08-29 ENCOUNTER — Ambulatory Visit: Payer: MEDICAID | Admitting: Physician Assistant

## 2024-08-29 DIAGNOSIS — E049 Nontoxic goiter, unspecified: Secondary | ICD-10-CM

## 2024-08-29 DIAGNOSIS — E118 Type 2 diabetes mellitus with unspecified complications: Secondary | ICD-10-CM

## 2024-08-29 DIAGNOSIS — I152 Hypertension secondary to endocrine disorders: Secondary | ICD-10-CM

## 2024-09-09 ENCOUNTER — Encounter: Payer: Self-pay | Admitting: Physician Assistant

## 2024-09-09 ENCOUNTER — Ambulatory Visit: Payer: Self-pay

## 2024-09-09 ENCOUNTER — Ambulatory Visit: Payer: MEDICAID | Admitting: Physician Assistant

## 2024-09-09 ENCOUNTER — Ambulatory Visit (INDEPENDENT_AMBULATORY_CARE_PROVIDER_SITE_OTHER): Payer: MEDICAID | Admitting: Physician Assistant

## 2024-09-09 VITALS — BP 148/97 | HR 76 | Temp 98.0°F | Resp 14 | Ht 65.0 in | Wt 259.4 lb

## 2024-09-09 DIAGNOSIS — R051 Acute cough: Secondary | ICD-10-CM | POA: Diagnosis not present

## 2024-09-09 DIAGNOSIS — I152 Hypertension secondary to endocrine disorders: Secondary | ICD-10-CM

## 2024-09-09 DIAGNOSIS — J4521 Mild intermittent asthma with (acute) exacerbation: Secondary | ICD-10-CM

## 2024-09-09 DIAGNOSIS — F17200 Nicotine dependence, unspecified, uncomplicated: Secondary | ICD-10-CM | POA: Diagnosis not present

## 2024-09-09 DIAGNOSIS — E1159 Type 2 diabetes mellitus with other circulatory complications: Secondary | ICD-10-CM

## 2024-09-09 DIAGNOSIS — Z7985 Long-term (current) use of injectable non-insulin antidiabetic drugs: Secondary | ICD-10-CM | POA: Diagnosis not present

## 2024-09-09 DIAGNOSIS — E119 Type 2 diabetes mellitus without complications: Secondary | ICD-10-CM | POA: Insufficient documentation

## 2024-09-09 MED ORDER — BENZONATATE 200 MG PO CAPS: 200.0000 mg | ORAL_CAPSULE | Freq: Three times a day (TID) | ORAL | 0 refills | Status: AC | PRN

## 2024-09-09 MED ORDER — ALBUTEROL SULFATE HFA 108 (90 BASE) MCG/ACT IN AERS: 2.0000 | INHALATION_SPRAY | Freq: Four times a day (QID) | RESPIRATORY_TRACT | 0 refills | Status: AC | PRN

## 2024-09-09 NOTE — Telephone Encounter (Signed)
 Noted. Pt was no show to appt.

## 2024-09-09 NOTE — Progress Notes (Signed)
 Established patient visit  Patient: Erin Hernandez   DOB: May 11, 1991   33 y.o. Female  MRN: [id number] Visit Date: 09/09/2024  Today's healthcare provider: Jolynn Spencer, PA-C   Chief Complaint  Patient presents with   Acute Visit    Cough that wont go away, stuffy nose, body aches x 1 week otc: nyquil/dayquil no covid home test taken   Subjective     HPI     Acute Visit    Additional comments: Cough that wont go away, stuffy nose, body aches x 1 week otc: nyquil/dayquil no covid home test taken      Last edited by Wilfred Hargis RAMAN, CMA on 09/09/2024 10:48 AM.       Discussed the use of AI scribe software for clinical note transcription with the patient, who gave verbal consent to proceed.  History of Present Illness Erin Hernandez is a 33 year old female with asthma who presents with a persistent cough and nasal congestion for one week.  She reports a 7-day history of persistent cough and nasal congestion, worse at night. She has used over-the-counter cold medications but symptoms persist. She denies fever, sore throat, or ear pain.  She has asthma triggered by illness and exercise and uses albuterol  inhalers. She smokes about five cigarettes daily and wants to quit.  She has missed her Ozempic  injections for three weeks due to pharmacy refill issues with the 0.5 mg pen.  She notes nasal congestion and postnasal drainage without fever.       01/05/2024    2:57 PM 09/08/2023   11:11 AM  Depression screen PHQ 2/9  Decreased Interest 3 2  Down, Depressed, Hopeless 3 1  PHQ - 2 Score 6 3  Altered sleeping 3 3  Tired, decreased energy 3 3  Change in appetite 3 3  Feeling bad or failure about yourself  2 1  Trouble concentrating 2 0  Moving slowly or fidgety/restless 1 0  Suicidal thoughts 1 0  PHQ-9 Score 21  13   Difficult doing work/chores Extremely dIfficult Somewhat difficult     Data saved with a previous flowsheet row definition      01/05/2024    2:58  PM 09/08/2023   11:11 AM  GAD 7 : Generalized Anxiety Score  Nervous, Anxious, on Edge 3 2  Control/stop worrying 3 1  Worry too much - different things 3 3  Trouble relaxing 3 1  Restless 2 0  Easily annoyed or irritable 3 1  Afraid - awful might happen 3 1  Total GAD 7 Score 20 9  Anxiety Difficulty Extremely difficult Somewhat difficult    Medications: Show/hide medication list[1]  Review of Systems All negative Except see HPI       Objective    BP (!) 148/97   Pulse 76   Temp 98 F (36.7 C) (Oral)   Resp 14   Ht 5' 5 (1.651 m)   Wt 259 lb 6.4 oz (117.7 kg)   LMP 08/26/2024   SpO2 98%   BMI 43.17 kg/m     Physical Exam Vitals reviewed.  Constitutional:      Appearance: She is normal weight.  HENT:     Head: Normocephalic and atraumatic.     Right Ear: Ear canal and external ear normal.     Left Ear: Ear canal and external ear normal.     Nose: Congestion and rhinorrhea present.     Mouth/Throat:     Pharynx: Posterior oropharyngeal  erythema present.     Comments: Postnasal drainage noted Eyes:     General: No scleral icterus.       Right eye: No discharge.        Left eye: No discharge.     Extraocular Movements: Extraocular movements intact.     Pupils: Pupils are equal, round, and reactive to light.  Cardiovascular:     Rate and Rhythm: Normal rate and regular rhythm.  Pulmonary:     Effort: Pulmonary effort is normal.     Breath sounds: Normal breath sounds.  Abdominal:     General: Abdomen is flat. Bowel sounds are normal.     Palpations: Abdomen is soft.  Lymphadenopathy:     Cervical: No cervical adenopathy.  Neurological:     Mental Status: She is alert.      No results found for any visits on 09/09/24.      Assessment & Plan Mild intermittent asthma with acute exacerbation Versus sinusitis x 7 days Versus upper respiratory infection x 7 days Exacerbation likely due to postnasal drainage and nasal congestion. Smoking may  contribute. There are no hx of asthma on the problem list. - Prescribed albuterol  inhaler for relief. - Recommended Flonase  for nasal inflammation. - Advised hydration and nasal saline spray. - Instructed to use MyChart in three days if symptoms persist for possible prednisone  or antibiotics. - Discussed Tessalon  for cough management. Return to clinic if symptoms persist or worsen   Type 2 diabetes mellitus Chronic and previously stable Last A1c was 6.6 2 months ago Ozempic  prescribed, but patient has not received it for three weeks due to pharmacy issue. Current dose 0.5 mg. Insurance covers medication. Continue taking metformin  - Advised to check with pharmacy for Ozempic  refill and correct dose. - Instructed to use MyChart for any pharmacy issues. Continue low carb diet and regular exercise. Will follow-up  Hypertension Chronic and unstable  elevated blood pressure noted.  Could be due to upper respiratory infection versus sinusitis versus flare up of asthma - Advised daily blood pressure monitoring and log keeping. - Recommended follow-up in six to seven weeks if elevated. Continue amlodipine  2.5 mg, lifestyle modifications  Tobacco use disorder Chronic  current smoker, five cigarettes per day. Acknowledges need to quit. - Advised to quit smoking to improve health and reduce asthma exacerbations. Will revisit   No orders of the defined types were placed in this encounter.   No follow-ups on file.   The patient was advised to call back or seek an in-person evaluation if the symptoms worsen or if the condition fails to improve as anticipated.  I discussed the assessment and treatment plan with the patient. The patient was provided an opportunity to ask questions and all were answered. The patient agreed with the plan and demonstrated an understanding of the instructions.  I, Jeaninne Lodico, PA-C have reviewed all documentation for this visit. The documentation on 09/09/2024   for the exam, diagnosis, procedures, and orders are all accurate and complete.  Jolynn Spencer, [facility], MMS [facility] [phone] (phone) [phone] (fax)  [facility]    [1]  Outpatient Medications Prior to Visit  Medication Sig   amLODipine  (NORVASC ) 2.5 MG tablet Take 1 tablet (2.5 mg total) by mouth daily.   Blood Glucose Monitoring Suppl DEVI 1 each by Does not apply route as directed. Dispense based on patient and insurance preference. Use up to four times daily as directed. (FOR ICD-10 E10.9, E11.9).   Glucose Blood (  BLOOD GLUCOSE TEST STRIPS) STRP 1 each by Does not apply route as directed. Dispense based on patient and insurance preference. Use up to four times daily as directed. (FOR ICD-10 E10.9, E11.9).   Lancet Device MISC 1 each by Does not apply route as directed. Dispense based on patient and insurance preference. Use up to four times daily as directed. (FOR ICD-10 E10.9, E11.9).   Lancets MISC 1 each by Does not apply route as directed. Dispense based on patient and insurance preference. Use up to four times daily as directed. (FOR ICD-10 E10.9, E11.9).   metFORMIN  (GLUCOPHAGE -XR) 500 MG 24 hr tablet Take 1 tablet (500 mg total) by mouth daily with breakfast.   Semaglutide ,0.25 or 0.5MG /DOS, (OZEMPIC , 0.25 OR 0.5 MG/DOSE,) 2 MG/3ML SOPN Inject 0.25 mg into the skin once a week.   No facility-administered medications prior to visit.

## 2024-09-09 NOTE — Telephone Encounter (Signed)
 FYI Only or Action Required?: FYI only for provider: appointment scheduled on today.  Patient was last seen in primary care on 07/18/2024 by Ostwalt, Janna, PA-C.  Called Nurse Triage reporting Cough.  Symptoms began a week ago.  Interventions attempted: OTC medications: Vicks Dayquil and Nyquil and Rest, hydration, or home remedies.  Symptoms are: gradually worsening.  Triage Disposition: See Physician Within 24 Hours  Patient/caregiver understands and will follow disposition?: Yes   Copied from CRM [id number]. Topic: Clinical - Red Word Triage >> Sep 09, 2024  8:08 AM Avram MATSU wrote: Red Word that prompted transfer to Nurse Triage: patient has been sick for a week not getting better. Cough that wont go away,stuffy nose,bodyaches Reason for Disposition  [1] Continuous (nonstop) coughing interferes with work or school AND [2] no improvement using cough treatment per Care Advice  Answer Assessment - Initial Assessment Questions 1. ONSET: When did the cough begin?      One week 2. SEVERITY: How bad is the cough today?      Keeping her awake at night 3. SPUTUM: Describe the color of your sputum (e.g., none, dry cough; clear, white, yellow, green)     Clear and green 4. HEMOPTYSIS: Are you coughing up any blood? If Yes, ask: How much? (e.g., flecks, streaks, tablespoons, etc.)     Denies  5. DIFFICULTY BREATHING: Are you having difficulty breathing? If Yes, ask: How bad is it? (e.g., mild, moderate, severe)      Mild shortness of breath when coughing 6. FEVER: Do you have a fever? If Yes, ask: What is your temperature, how was it measured, and when did it start?     denies 7. CARDIAC HISTORY: Do you have any history of heart disease? (e.g., heart attack, congestive heart failure)       8. LUNG HISTORY: Do you have any history of lung disease?  (e.g., pulmonary embolus, asthma, emphysema)      9. PE RISK FACTORS: Do you have a history of blood clots? (or:  recent major surgery, recent prolonged travel, bedridden)      10. OTHER SYMPTOMS: Do you have any other symptoms? (e.g., runny nose, wheezing, chest pain)       Body aches 11. PREGNANCY: Is there any chance you are pregnant? When was your last menstrual period?       12. TRAVEL: Have you traveled out of the country in the last month? (e.g., travel history, exposures)  Protocols used: Cough - Acute Productive-A-AH
